# Patient Record
Sex: Female | Born: 1990 | Race: White | Hispanic: No | Smoking: Never smoker
Health system: Southern US, Community
[De-identification: ages and names within clinical notes are randomized; demographics above are authoritative.]

## PROBLEM LIST (undated history)

## (undated) DIAGNOSIS — O24419 Gestational diabetes mellitus in pregnancy, unspecified control: Secondary | ICD-10-CM

## (undated) HISTORY — PX: KNEE BIOPSY: SHX1893

## (undated) HISTORY — PX: CARPAL TUNNEL RELEASE: SHX101

---
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
## 2008-07-26 ENCOUNTER — Observation Stay: Payer: Self-pay

## 2009-03-02 ENCOUNTER — Emergency Department: Payer: Self-pay | Admitting: Emergency Medicine

## 2009-06-08 ENCOUNTER — Encounter: Payer: Self-pay | Admitting: Obstetrics and Gynecology

## 2009-06-15 ENCOUNTER — Encounter: Payer: Self-pay | Admitting: Obstetrics and Gynecology

## 2009-07-10 ENCOUNTER — Encounter: Payer: Self-pay | Admitting: Obstetrics & Gynecology

## 2010-05-06 ENCOUNTER — Emergency Department: Payer: Self-pay | Admitting: Emergency Medicine

## 2010-07-12 ENCOUNTER — Ambulatory Visit: Payer: Self-pay | Admitting: Family

## 2010-10-16 ENCOUNTER — Ambulatory Visit: Payer: Self-pay | Admitting: Internal Medicine

## 2010-11-10 ENCOUNTER — Emergency Department: Payer: Self-pay | Admitting: Emergency Medicine

## 2010-12-26 ENCOUNTER — Ambulatory Visit: Payer: Self-pay | Admitting: Specialist

## 2010-12-28 ENCOUNTER — Ambulatory Visit: Payer: Self-pay | Admitting: Specialist

## 2011-09-17 ENCOUNTER — Emergency Department: Payer: Self-pay | Admitting: Emergency Medicine

## 2014-03-21 DIAGNOSIS — Z8742 Personal history of other diseases of the female genital tract: Secondary | ICD-10-CM | POA: Insufficient documentation

## 2015-09-08 DIAGNOSIS — G43909 Migraine, unspecified, not intractable, without status migrainosus: Secondary | ICD-10-CM | POA: Insufficient documentation

## 2016-10-11 DIAGNOSIS — K219 Gastro-esophageal reflux disease without esophagitis: Secondary | ICD-10-CM | POA: Insufficient documentation

## 2016-10-11 DIAGNOSIS — L0292 Furuncle, unspecified: Secondary | ICD-10-CM | POA: Insufficient documentation

## 2016-10-11 DIAGNOSIS — F32A Depression, unspecified: Secondary | ICD-10-CM | POA: Insufficient documentation

## 2016-10-11 DIAGNOSIS — L0293 Carbuncle, unspecified: Secondary | ICD-10-CM | POA: Insufficient documentation

## 2016-10-11 DIAGNOSIS — F419 Anxiety disorder, unspecified: Secondary | ICD-10-CM | POA: Insufficient documentation

## 2017-08-14 DIAGNOSIS — G5603 Carpal tunnel syndrome, bilateral upper limbs: Secondary | ICD-10-CM | POA: Insufficient documentation

## 2018-03-24 DIAGNOSIS — M797 Fibromyalgia: Secondary | ICD-10-CM | POA: Insufficient documentation

## 2019-02-01 DIAGNOSIS — Z62810 Personal history of physical and sexual abuse in childhood: Secondary | ICD-10-CM | POA: Insufficient documentation

## 2020-10-11 DIAGNOSIS — M25461 Effusion, right knee: Secondary | ICD-10-CM | POA: Insufficient documentation

## 2020-12-11 DIAGNOSIS — Z9889 Other specified postprocedural states: Secondary | ICD-10-CM | POA: Insufficient documentation

## 2021-03-21 DIAGNOSIS — G8929 Other chronic pain: Secondary | ICD-10-CM | POA: Insufficient documentation

## 2021-03-23 DIAGNOSIS — E559 Vitamin D deficiency, unspecified: Secondary | ICD-10-CM | POA: Insufficient documentation

## 2021-03-23 DIAGNOSIS — R768 Other specified abnormal immunological findings in serum: Secondary | ICD-10-CM | POA: Insufficient documentation

## 2021-06-25 ENCOUNTER — Other Ambulatory Visit: Payer: Self-pay

## 2021-06-25 ENCOUNTER — Emergency Department: Payer: Medicaid Other

## 2021-06-25 ENCOUNTER — Emergency Department
Admission: EM | Admit: 2021-06-25 | Discharge: 2021-06-25 | Disposition: A | Discharge status: Home / Self Care | Payer: Medicaid Other | Attending: Emergency Medicine | Admitting: Emergency Medicine

## 2021-06-25 DIAGNOSIS — Z5321 Procedure and treatment not carried out due to patient leaving prior to being seen by health care provider: Secondary | ICD-10-CM | POA: Insufficient documentation

## 2021-06-25 DIAGNOSIS — R519 Headache, unspecified: Secondary | ICD-10-CM | POA: Diagnosis not present

## 2021-06-25 DIAGNOSIS — J029 Acute pharyngitis, unspecified: Secondary | ICD-10-CM | POA: Diagnosis not present

## 2021-06-25 DIAGNOSIS — R059 Cough, unspecified: Secondary | ICD-10-CM | POA: Diagnosis present

## 2021-06-25 NOTE — ED Triage Notes (Signed)
Pt here with flu-like symptoms that started on Friday. Pt states that she went to [facility] yesterday and they told her that she had the flu. Pt also has generalized pain and HA. Pt in NAD in triage.

## 2021-08-09 DIAGNOSIS — M5416 Radiculopathy, lumbar region: Secondary | ICD-10-CM | POA: Insufficient documentation

## 2021-09-18 DIAGNOSIS — M25512 Pain in left shoulder: Secondary | ICD-10-CM | POA: Insufficient documentation

## 2021-10-04 DIAGNOSIS — M25561 Pain in right knee: Secondary | ICD-10-CM | POA: Insufficient documentation

## 2021-10-27 ENCOUNTER — Other Ambulatory Visit: Payer: Self-pay

## 2021-10-27 ENCOUNTER — Ambulatory Visit
Admission: EM | Admit: 2021-10-27 | Discharge: 2021-10-27 | Disposition: A | Discharge status: Home / Self Care | Payer: Medicaid Other | Attending: Emergency Medicine | Admitting: Emergency Medicine

## 2021-10-27 DIAGNOSIS — L02416 Cutaneous abscess of left lower limb: Secondary | ICD-10-CM | POA: Diagnosis not present

## 2021-10-27 MED ORDER — CEPHALEXIN 500 MG PO CAPS: 500.0000 mg | ORAL_CAPSULE | Freq: Two times a day (BID) | ORAL | 0 refills | Status: AC

## 2021-10-27 NOTE — Discharge Instructions (Signed)
Take Keflex twice a day for the next 7 days ? ?Hold warm-hot compresses to affected area at least 4 times a day, this helps to facilitate draining, the more the better ? ?Please return for evaluation for increased swelling, increased tenderness or pain, non healing site, non draining site, you begin to have fever or chills  ? ?We reviewed the etiology of recurrent abscesses of skin.  Skin abscesses are collections of pus within the dermis and deeper skin tissues. Skin abscesses manifest as painful, tender, fluctuant, and erythematous nodules, frequently surmounted by a pustule and surrounded by a rim of erythematous swelling.  Spontaneous drainage of purulent material may occur.  Fever can occur on occasion.   ? ?-Skin abscesses can develop in healthy individuals with no predisposing conditions other than skin or nasal carriage of Staphylococcus aureus.  Individuals in close contact with others who have active infection with skin abscesses are at increased risk which is likely to explain why twin brother has similar episodes.   In addition, any process leading to a breach in the skin barrier can also predispose to the development of a skin abscesses, such as atopic dermatitis.    ?

## 2021-10-27 NOTE — ED Provider Notes (Signed)
?[facility] ? ? ? ?CSN: [id number] ?Arrival date & time: 10/27/21  8366 ? ? ?  ? ?History   ?Chief Complaint ?Chief Complaint  ?Patient presents with  ? Mass  ? ? ?HPI ?Stacey Reed is a 31 y.o. female.  ? ?Patient presents with boil to the inner left thigh for 2 to 3 days.  Endorses that it has increased in size become erythematous and tender.  Denies drainage, fever or chills.  Has not attempted treatment of symptoms. ? ?History reviewed. No pertinent past medical history. ? ?There are no problems to display for this patient. ? ? ?History reviewed. No pertinent surgical history. ? ?OB History   ?No obstetric history on file. ?  ? ? ? ?Home Medications   ? ?Prior to Admission medications   ?Medication Sig Start Date End Date Taking? Authorizing Provider  ?cephALEXin (KEFLEX) 500 MG capsule Take 1 capsule (500 mg total) by mouth 2 (two) times daily for 7 days. 10/27/21 11/03/21 Yes Valinda Hoar, NP  ? ? ?Family History ?History reviewed. No pertinent family history. ? ?Social History ?Social History  ? ?Tobacco Use  ? Smoking status: Never  ? Smokeless tobacco: Never  ?Vaping Use  ? Vaping Use: Never used  ?Substance Use Topics  ? Alcohol use: Never  ? Drug use: Never  ? ? ? ?Allergies   ?Doxycycline, Sulfa antibiotics, Acetaminophen-codeine, Metronidazole, and Nitrofurantoin ? ? ?Review of Systems ?Review of Systems  ?Constitutional: Negative.   ?Respiratory: Negative.    ?Cardiovascular: Negative.   ?Skin:  Positive for wound. Negative for color change, pallor and rash.  ?Neurological: Negative.   ? ? ?Physical Exam ?Triage Vital Signs ?ED Triage Vitals  ?Enc Vitals Group  ?   BP 10/27/21 0848 115/82  ?   Pulse Rate 10/27/21 0848 87  ?   Resp 10/27/21 0848 18  ?   Temp 10/27/21 0848 98.3 ?F (36.8 ?C)  ?   Temp Source 10/27/21 0848 Oral  ?   SpO2 10/27/21 0848 99 %  ?   Weight 10/27/21 0847 230 lb (104.3 kg)  ?   Height 10/27/21 0847 5' (1.524 m)  ?   Head Circumference --   ?   Peak Flow --   ?    Pain Score 10/27/21 0846 8  ?   Pain Loc --   ?   Pain Edu? --   ?   Excl. in GC? --   ? ?No data found. ? ?Updated Vital Signs ?BP 115/82 (BP Location: Left Arm)   Pulse 87   Temp 98.3 ?F (36.8 ?C) (Oral)   Resp 18   Ht 5' (1.524 m)   Wt 230 lb (104.3 kg)   LMP 10/27/2021   SpO2 99%   BMI 44.92 kg/m?  ? ?Visual Acuity ?Right Eye Distance:   ?Left Eye Distance:   ?Bilateral Distance:   ? ?Right Eye Near:   ?Left Eye Near:    ?Bilateral Near:    ? ?Physical Exam ?Constitutional:   ?   Appearance: Normal appearance.  ?HENT:  ?   Head: Normocephalic.  ?Eyes:  ?   Extraocular Movements: Extraocular movements intact.  ?Pulmonary:  ?   Effort: Pulmonary effort is normal.  ?Skin: ?   Comments: 1 x 2 cm immature abscess present to the medial aspect of the left thigh, erythematous and tender, nondraining  ?Neurological:  ?   Mental Status: She is alert and oriented to person, place, and time. Mental status  is at baseline.  ?Psychiatric:     ?   Mood and Affect: Mood normal.     ?   Behavior: Behavior normal.  ? ? ? ?UC Treatments / Results  ?Labs ?(all labs ordered are listed, but only abnormal results are displayed) ?Labs Reviewed - No data to display ? ?EKG ? ? ?Radiology ?No results found. ? ?Procedures ?Procedures (including critical care time) ? ?Medications Ordered in UC ?Medications - No data to display ? ?Initial Impression / Assessment and Plan / UC Course  ?I have reviewed the triage vital signs and the nursing notes. ? ?Pertinent labs & imaging results that were available during my care of the patient were reviewed by me and considered in my medical decision making (see chart for details). ? ?Abscess of left thigh ? ?Abscess is not at a point to be drained, discussed with patient, Keflex 7-day course prescribed, advised warm compresses to the affected area at least 4 times daily to help facilitate drainage, given strict precautions to return to urgent care for nonhealing nondraining site for further  evaluation and management ?Final Clinical Impressions(s) / UC Diagnoses  ? ?Final diagnoses:  ?Abscess of left thigh  ? ? ? ?Discharge Instructions   ? ?  ?Take Keflex twice a day for the next 7 days ? ?Hold warm-hot compresses to affected area at least 4 times a day, this helps to facilitate draining, the more the better ? ?Please return for evaluation for increased swelling, increased tenderness or pain, non healing site, non draining site, you begin to have fever or chills  ? ?We reviewed the etiology of recurrent abscesses of skin.  Skin abscesses are collections of pus within the dermis and deeper skin tissues. Skin abscesses manifest as painful, tender, fluctuant, and erythematous nodules, frequently surmounted by a pustule and surrounded by a rim of erythematous swelling.  Spontaneous drainage of purulent material may occur.  Fever can occur on occasion.   ? ?-Skin abscesses can develop in healthy individuals with no predisposing conditions other than skin or nasal carriage of Staphylococcus aureus.  Individuals in close contact with others who have active infection with skin abscesses are at increased risk which is likely to explain why twin brother has similar episodes.   In addition, any process leading to a breach in the skin barrier can also predispose to the development of a skin abscesses, such as atopic dermatitis.    ? ? ?ED Prescriptions   ? ? Medication Sig Dispense Auth. Provider  ? cephALEXin (KEFLEX) 500 MG capsule Take 1 capsule (500 mg total) by mouth 2 (two) times daily for 7 days. 14 capsule Valinda Hoar, NP  ? ?  ? ?PDMP not reviewed this encounter. ?  ?Valinda Hoar, NP ?10/27/21 8916 ? ?

## 2021-10-27 NOTE — ED Triage Notes (Signed)
Pt c/o boil on left inner thigh x3days. Pt states that the boil is not oozing and feels soft. Pt states that it is red and purple in color.  ? ?Pt states that it is red and has gotten bigger in the last few days.  ?

## 2021-11-16 ENCOUNTER — Other Ambulatory Visit: Payer: Self-pay

## 2021-11-16 ENCOUNTER — Ambulatory Visit
Admission: EM | Admit: 2021-11-16 | Discharge: 2021-11-16 | Disposition: A | Discharge status: Home / Self Care | Payer: Medicaid Other | Attending: Internal Medicine | Admitting: Internal Medicine

## 2021-11-16 DIAGNOSIS — M79651 Pain in right thigh: Secondary | ICD-10-CM

## 2021-11-16 LAB — URINALYSIS, MICROSCOPIC (REFLEX)

## 2021-11-16 LAB — URINALYSIS, ROUTINE W REFLEX MICROSCOPIC
Bilirubin Urine: NEGATIVE
Glucose, UA: NEGATIVE mg/dL
Ketones, ur: NEGATIVE mg/dL
Leukocytes,Ua: NEGATIVE
Nitrite: NEGATIVE
Protein, ur: NEGATIVE mg/dL
Specific Gravity, Urine: 1.02 (ref 1.005–1.030)
pH: 6 (ref 5.0–8.0)

## 2021-11-16 MED ORDER — METHOCARBAMOL 500 MG PO TABS: 500.0000 mg | ORAL_TABLET | Freq: Every evening | ORAL | 0 refills | Status: DC | PRN
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
## 2021-11-16 NOTE — ED Triage Notes (Signed)
Patient is here for "UTI symptoms" including urinary frequency with urgency, "burning at times". Note: Started with "right hip pain at first" (no injury). No fever.  ?

## 2021-11-16 NOTE — Discharge Instructions (Signed)
Gentle stretching exercises ?Heating pad use on a 20-minute on-20 minutes off cycle ?Continue ibuprofen use ?Please do not drive or operate heavy machinery after taking muscle relaxants because muscle relaxants makes you drowsy ?Please return to urgent care if you have any other concerns. ?

## 2021-11-30 ENCOUNTER — Other Ambulatory Visit: Payer: Self-pay

## 2021-11-30 ENCOUNTER — Encounter: Payer: Self-pay | Admitting: Emergency Medicine

## 2021-11-30 ENCOUNTER — Ambulatory Visit
Admission: EM | Admit: 2021-11-30 | Discharge: 2021-11-30 | Disposition: A | Discharge status: Home / Self Care | Payer: Medicaid Other | Attending: Emergency Medicine | Admitting: Emergency Medicine

## 2021-11-30 DIAGNOSIS — R0982 Postnasal drip: Secondary | ICD-10-CM | POA: Diagnosis not present

## 2021-11-30 DIAGNOSIS — J309 Allergic rhinitis, unspecified: Secondary | ICD-10-CM

## 2021-11-30 LAB — GROUP A STREP BY PCR: Group A Strep by PCR: NOT DETECTED

## 2021-11-30 MED ORDER — IPRATROPIUM BROMIDE 0.06 % NA SOLN: 2.0000 | Freq: Four times a day (QID) | NASAL | 12 refills | Status: DC

## 2021-11-30 MED ORDER — CETIRIZINE HCL 10 MG PO TABS
10.0000 mg | ORAL_TABLET | Freq: Every day | ORAL | 2 refills | Status: DC
Start: 2021-11-30 — End: 2022-08-23

## 2021-11-30 NOTE — Discharge Instructions (Addendum)
Take Zyrtec 10 mg once daily for control of your allergy symptoms. ? ?Perform sinus irrigation with a NeilMed sinus rinse kit and distilled water 2-3 times a day to wash away pollen particles and mold spores which could be attributing to nasal congestion and mucus production. ? ?Use the Atrovent nasal spray, 2 squirts up each nostril 4 times a day, as needed for nasal congestion and postnasal drip. ? ?Return for reevaluation for any new or worsening symptoms.   ?

## 2021-11-30 NOTE — ED Triage Notes (Signed)
Patient c/o sore throat for a week.  Patient denies fevers.  
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
## 2022-02-06 ENCOUNTER — Encounter: Payer: Self-pay | Admitting: *Deleted

## 2022-02-06 ENCOUNTER — Emergency Department
Admission: EM | Admit: 2022-02-06 | Discharge: 2022-02-06 | Disposition: A | Discharge status: Home / Self Care | Payer: Medicaid Other | Attending: Emergency Medicine | Admitting: Emergency Medicine

## 2022-02-06 ENCOUNTER — Emergency Department: Payer: Medicaid Other

## 2022-02-06 ENCOUNTER — Other Ambulatory Visit: Payer: Self-pay

## 2022-02-06 DIAGNOSIS — G8929 Other chronic pain: Secondary | ICD-10-CM | POA: Insufficient documentation

## 2022-02-06 DIAGNOSIS — M25561 Pain in right knee: Secondary | ICD-10-CM | POA: Diagnosis not present

## 2022-02-06 MED ORDER — IBUPROFEN 600 MG PO TABS
600.0000 mg | ORAL_TABLET | Freq: Once | ORAL | Status: AC
  Administered 2022-02-06: 600 mg via ORAL
  Filled 2022-02-06: qty 1

## 2022-02-06 NOTE — Discharge Instructions (Signed)
Please follow-up with orthopedics for further management of your knee pain.  Please return for any new, worsening, or change in symptoms or other concerns including, swelling, fever, or any other concerns.  It was a pleasure caring for you today.
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
## 2022-02-06 NOTE — ED Triage Notes (Signed)
Pt has right knee pain    no known injury.   Pt reports swelling   sx for 2 days.  Pt alert

## 2022-03-25 ENCOUNTER — Other Ambulatory Visit: Payer: Medicaid Other

## 2022-06-08 ENCOUNTER — Emergency Department: Payer: Medicaid Other

## 2022-06-08 ENCOUNTER — Other Ambulatory Visit: Payer: Self-pay

## 2022-06-08 DIAGNOSIS — R079 Chest pain, unspecified: Secondary | ICD-10-CM | POA: Insufficient documentation

## 2022-06-08 DIAGNOSIS — Z5321 Procedure and treatment not carried out due to patient leaving prior to being seen by health care provider: Secondary | ICD-10-CM | POA: Diagnosis not present

## 2022-06-08 LAB — CBC
HCT: 36.7 % (ref 36.0–46.0)
Hemoglobin: 11.8 g/dL — ABNORMAL LOW (ref 12.0–15.0)
MCH: 27.4 pg (ref 26.0–34.0)
MCHC: 32.2 g/dL (ref 30.0–36.0)
MCV: 85.2 fL (ref 80.0–100.0)
Platelets: 297 10*3/uL (ref 150–400)
RBC: 4.31 MIL/uL (ref 3.87–5.11)
RDW: 12.8 % (ref 11.5–15.5)
WBC: 14.2 10*3/uL — ABNORMAL HIGH (ref 4.0–10.5)
nRBC: 0 % (ref 0.0–0.2)

## 2022-06-08 LAB — COMPREHENSIVE METABOLIC PANEL
ALT: 16 U/L (ref 0–44)
AST: 14 U/L — ABNORMAL LOW (ref 15–41)
Albumin: 3.9 g/dL (ref 3.5–5.0)
Alkaline Phosphatase: 51 U/L (ref 38–126)
Anion gap: 6 (ref 5–15)
BUN: 14 mg/dL (ref 6–20)
CO2: 26 mmol/L (ref 22–32)
Calcium: 8.8 mg/dL — ABNORMAL LOW (ref 8.9–10.3)
Chloride: 107 mmol/L (ref 98–111)
Creatinine, Ser: 0.9 mg/dL (ref 0.44–1.00)
GFR, Estimated: >60 mL/min (ref >60)
Glucose, Bld: 105 mg/dL — ABNORMAL HIGH (ref 70–99)
Potassium: 3.7 mmol/L (ref 3.5–5.1)
Sodium: 139 mmol/L (ref 135–145)
Total Bilirubin: 0.6 mg/dL (ref 0.3–1.2)
Total Protein: 7.1 g/dL (ref 6.5–8.1)

## 2022-06-08 LAB — TROPONIN I (HIGH SENSITIVITY): Troponin I (High Sensitivity): <2 ng/L (ref <18)

## 2022-06-08 NOTE — ED Provider Triage Note (Signed)
Emergency Medicine Provider Triage Evaluation Note  Stacey Reed , a 31 y.o. female  was evaluated in triage.  Pt complains of chest pain on L side. Radiates from L chest into L shoulder. No shob, URI symptoms, GI symptoms. Symptoms x 1 month. No alleviates or worsens symptoms  Review of Systems  Positive: CP Negative: Cough, shob, fever, chills  Physical Exam  There were no vitals taken for this visit. Gen:   Awake, no distress   Resp:  Normal effort  MSK:   Moves extremities without difficulty  Other:    Medical Decision Making  Medically screening exam initiated at 8:30 PM.  Appropriate orders placed.  Stacey Reed was informed that the remainder of the evaluation will be completed by another provider, this initial triage assessment does not replace that evaluation, and the importance of remaining in the ED until their evaluation is complete.  CP x 1 month. Cardiac workup with labs, ekg, chest xray   Darletta Moll, PA-C 06/08/22 2034

## 2022-06-08 NOTE — ED Triage Notes (Signed)
Pov from home with L sided chest pain for 1 month. PT stating pain with palpation, radiating through back and down L arm.  NAD in triage.

## 2022-06-09 ENCOUNTER — Emergency Department
Admission: EM | Admit: 2022-06-09 | Discharge: 2022-06-09 | Discharge status: Against Medical Advice | Payer: Medicaid Other | Attending: Emergency Medicine | Admitting: Emergency Medicine

## 2022-06-27 ENCOUNTER — Ambulatory Visit: Payer: Medicaid Other | Admitting: Nurse Practitioner

## 2022-06-29 ENCOUNTER — Other Ambulatory Visit: Payer: Self-pay

## 2022-06-29 ENCOUNTER — Emergency Department: Payer: Medicaid Other

## 2022-06-29 DIAGNOSIS — M5412 Radiculopathy, cervical region: Secondary | ICD-10-CM | POA: Diagnosis not present

## 2022-06-29 DIAGNOSIS — R0789 Other chest pain: Secondary | ICD-10-CM | POA: Diagnosis present

## 2022-06-29 LAB — CBC
HCT: 37.4 % (ref 36.0–46.0)
Hemoglobin: 12.1 g/dL (ref 12.0–15.0)
MCH: 27.2 pg (ref 26.0–34.0)
MCHC: 32.4 g/dL (ref 30.0–36.0)
MCV: 84 fL (ref 80.0–100.0)
Platelets: 326 10*3/uL (ref 150–400)
RBC: 4.45 MIL/uL (ref 3.87–5.11)
RDW: 13 % (ref 11.5–15.5)
WBC: 13.4 10*3/uL — ABNORMAL HIGH (ref 4.0–10.5)
nRBC: 0 % (ref 0.0–0.2)

## 2022-06-29 LAB — BASIC METABOLIC PANEL
Anion gap: 6 (ref 5–15)
BUN: 17 mg/dL (ref 6–20)
CO2: 27 mmol/L (ref 22–32)
Calcium: 9.1 mg/dL (ref 8.9–10.3)
Chloride: 108 mmol/L (ref 98–111)
Creatinine, Ser: 1.03 mg/dL — ABNORMAL HIGH (ref 0.44–1.00)
GFR, Estimated: >60 mL/min (ref >60)
Glucose, Bld: 101 mg/dL — ABNORMAL HIGH (ref 70–99)
Potassium: 3.8 mmol/L (ref 3.5–5.1)
Sodium: 141 mmol/L (ref 135–145)

## 2022-06-29 LAB — TROPONIN I (HIGH SENSITIVITY): Troponin I (High Sensitivity): <2 ng/L (ref <18)

## 2022-06-29 NOTE — ED Triage Notes (Signed)
Pt to the ED with complaint of left sided chest pain for over a month.  Radiates to back and down left arm.  Describes it as sharp.  No n/v/d or shob.

## 2022-06-30 ENCOUNTER — Emergency Department
Admission: EM | Admit: 2022-06-30 | Discharge: 2022-06-30 | Disposition: A | Discharge status: Home / Self Care | Payer: Medicaid Other | Attending: Emergency Medicine | Admitting: Emergency Medicine

## 2022-06-30 DIAGNOSIS — M25512 Pain in left shoulder: Secondary | ICD-10-CM

## 2022-06-30 DIAGNOSIS — M5412 Radiculopathy, cervical region: Secondary | ICD-10-CM

## 2022-06-30 DIAGNOSIS — R079 Chest pain, unspecified: Secondary | ICD-10-CM

## 2022-06-30 LAB — HCG, QUANTITATIVE, PREGNANCY: hCG, Beta Chain, Quant, S: <1 m[IU]/mL (ref <5)

## 2022-06-30 MED ORDER — CYCLOBENZAPRINE HCL 10 MG PO TABS
5.0000 mg | ORAL_TABLET | Freq: Once | ORAL | Status: AC
  Administered 2022-06-30: 5 mg via ORAL
  Filled 2022-06-30: qty 1

## 2022-06-30 MED ORDER — CYCLOBENZAPRINE HCL 5 MG PO TABS: 5.0000 mg | ORAL_TABLET | Freq: Three times a day (TID) | ORAL | 0 refills | Status: AC | PRN

## 2022-06-30 NOTE — ED Provider Notes (Signed)
[facility] Provider Note   Event Date/Time   First MD Initiated Contact with Patient 06/30/22 0102     (approximate) History  Chest Pain  HPI Stacey Reed is a 31 y.o. female with a stated past medical history of lumbar radiculopathy, chronic migraines, morbid obesity, and fibromyalgia who presents for left upper chest wall pain that radiates through to her scapula and occasionally radiates down the left arm with a description of a shooting sharp pain.  Patient endorses worsening of this pain with movement at the neck including trying to get up from laying flat or turning her head to the left.  Patient denies any known relieving factors.  Patient states that she has been trying to use ibuprofen and Tylenol with minimal effect ROS: Patient currently denies any vision changes, tinnitus, difficulty speaking, facial droop, sore throat, chest pain, shortness of breath, abdominal pain, nausea/vomiting/diarrhea, dysuria, or weakness/numbness in any extremity   Physical Exam  Triage Vital Signs: ED Triage Vitals  Enc Vitals Group     BP 06/29/22 2231 114/85     Pulse Rate 06/29/22 2231 78     Resp 06/29/22 2231 17     Temp 06/29/22 2231 97.6 F (36.4 C)     Temp Source 06/29/22 2231 Oral     SpO2 06/29/22 2231 98 %     Weight --      Height --      Head Circumference --      Peak Flow --      Pain Score 06/29/22 2232 5     Pain Loc --      Pain Edu? --      Excl. in GC? --    Most recent vital signs: Vitals:   06/29/22 2231  BP: 114/85  Pulse: 78  Resp: 17  Temp: 97.6 F (36.4 C)  SpO2: 98%   General: Awake, oriented x4. CV:  Good peripheral perfusion.  Resp:  Normal effort.  Abd:  No distention.  Other:  Middle-aged morbidly obese Caucasian female laying in bed in no acute distress.  Spurling test positive on the left for left arm paresthesias.  Full range of motion and sensation intact as well as strength in the left arm ED Results / Procedures /  Treatments  Labs (all labs ordered are listed, but only abnormal results are displayed) Labs Reviewed  BASIC METABOLIC PANEL - Abnormal; Notable for the following components:      Result Value   Glucose, Bld 101 (*)    Creatinine, Ser 1.03 (*)    All other components within normal limits  CBC - Abnormal; Notable for the following components:   WBC 13.4 (*)    All other components within normal limits  HCG, QUANTITATIVE, PREGNANCY  TROPONIN I (HIGH SENSITIVITY)  TROPONIN I (HIGH SENSITIVITY)   EKG ED ECG REPORT I, Merwyn Katos, the attending physician, personally viewed and interpreted this ECG. Date: 06/30/2022 EKG Time: 2237 Rate: 86 Rhythm: normal sinus rhythm QRS Axis: normal Intervals: normal ST/T Wave abnormalities: normal Narrative Interpretation: no evidence of acute ischemia RADIOLOGY ED MD interpretation: 2 view chest x-ray interpreted by me shows no evidence of acute abnormalities including no pneumonia, pneumothorax, or widened mediastinum -Agree with radiology assessment Official radiology report(s): DG Chest 2 View  Result Date: 06/29/2022 CLINICAL DATA:  Chest pain.  Left-sided pain. EXAM: CHEST - 2 VIEW COMPARISON:  06/08/2022 FINDINGS: The cardiomediastinal contours are normal. The lungs are clear. Pulmonary vasculature is normal.  No consolidation, pleural effusion, or pneumothorax. No acute osseous abnormalities are seen. IMPRESSION: Negative radiographs of the chest. Electronically Signed   By: Keith Rake M.D.   On: 06/29/2022 22:54   PROCEDURES: Critical Care performed: No .1-3 Lead EKG Interpretation  Performed by: Naaman Plummer, MD Authorized by: Naaman Plummer, MD     Interpretation: normal     ECG rate:  79   ECG rate assessment: normal     Rhythm: sinus rhythm     Ectopy: none     Conduction: normal    MEDICATIONS ORDERED IN ED: Medications  cyclobenzaprine (FLEXERIL) tablet 5 mg (5 mg Oral Given 06/30/22 0104)   IMPRESSION / MDM /  ASSESSMENT AND PLAN / ED COURSE  I reviewed the triage vital signs and the nursing notes.                             The patient is on the cardiac monitor to evaluate for evidence of arrhythmia and/or significant heart rate changes. Patient's presentation is most consistent with acute presentation with potential threat to life or bodily function. This patient presents with atypical chest pain, most likely secondary to musculoskeletal injury. Differential diagnosis includes rib fracture, costochondritis, sternal fracture. Low suspicion for ACS, acute PE (PERC negative), pericarditis / myocarditis, thoracic aortic dissection, pneumothorax, pneumonia or other acute infectious process. Presentation not consistent with other acute, emergent causes of chest pain at this time. No indication for cardiac enzyme testing. Plan to order CXR to evaluate for acute cardiopulmonary causes.  Plan: EKG, CXR, pain control  Dispo: Discharge home with home care   FINAL CLINICAL IMPRESSION(S) / ED DIAGNOSES   Final diagnoses:  Left-sided chest pain  Acute pain of left shoulder  Cervical radiculopathy   Rx / DC Orders   ED Discharge Orders          Ordered    cyclobenzaprine (FLEXERIL) 5 MG tablet  3 times daily PRN        06/30/22 0142           Note:  This document was prepared using Dragon voice recognition software and may include unintentional dictation errors.   Naaman Plummer, MD 06/30/22 [phone]

## 2022-06-30 NOTE — Discharge Instructions (Addendum)
Please use ibuprofen (Motrin) up to 800 mg every 8 hours, naproxen (Naprosyn) up to 500 mg every 12 hours, and/or acetaminophen (Tylenol) up to 4 g/day for any continued pain 

## 2022-07-15 ENCOUNTER — Ambulatory Visit
Admission: RE | Admit: 2022-07-15 | Discharge: 2022-07-15 | Disposition: A | Discharge status: Home / Self Care | Payer: Self-pay | Source: Ambulatory Visit | Attending: Neurosurgery | Admitting: Neurosurgery

## 2022-07-15 ENCOUNTER — Other Ambulatory Visit: Payer: Self-pay

## 2022-07-15 DIAGNOSIS — Z049 Encounter for examination and observation for unspecified reason: Secondary | ICD-10-CM

## 2022-07-16 ENCOUNTER — Other Ambulatory Visit: Payer: Self-pay

## 2022-07-16 ENCOUNTER — Emergency Department
Admission: EM | Admit: 2022-07-16 | Discharge: 2022-07-16 | Disposition: A | Discharge status: Home / Self Care | Payer: Medicaid Other | Attending: Student in an Organized Health Care Education/Training Program | Admitting: Student in an Organized Health Care Education/Training Program

## 2022-07-16 DIAGNOSIS — M542 Cervicalgia: Secondary | ICD-10-CM | POA: Insufficient documentation

## 2022-07-16 DIAGNOSIS — R519 Headache, unspecified: Secondary | ICD-10-CM | POA: Insufficient documentation

## 2022-07-16 DIAGNOSIS — Y92410 Unspecified street and highway as the place of occurrence of the external cause: Secondary | ICD-10-CM | POA: Diagnosis not present

## 2022-07-16 MED ORDER — CYCLOBENZAPRINE HCL 10 MG PO TABS: 10.0000 mg | ORAL_TABLET | Freq: Three times a day (TID) | ORAL | 0 refills | Status: AC | PRN

## 2022-07-16 MED ORDER — MELOXICAM 15 MG PO TABS: 15.0000 mg | ORAL_TABLET | Freq: Every day | ORAL | 0 refills | Status: AC
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
## 2022-07-16 NOTE — Discharge Instructions (Addendum)
-  You may take Tylenol and meloxicam as needed for pain.  You may additionally take cyclobenzaprine as needed for muscle relaxation, to use caution as may make you dizzy/drowsy and increase your risk of falls.  If he makes you too drowsy during the day, you can opt to only taking it at night.  -Return to the emergency department at any time if you begin to experience any new or worsening symptoms.
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
## 2022-07-25 MED ORDER — DEXAMETHASONE SODIUM PHOSPHATE 10 MG/ML IJ SOLN
INTRAMUSCULAR | Status: AC
  Filled 2022-07-25: qty 1

## 2022-07-25 MED ORDER — MIDAZOLAM HCL 2 MG/2ML IJ SOLN
INTRAMUSCULAR | Status: AC
  Filled 2022-07-25: qty 2

## 2022-07-25 MED ORDER — FENTANYL CITRATE (PF) 100 MCG/2ML IJ SOLN
INTRAMUSCULAR | Status: AC
  Filled 2022-07-25: qty 2

## 2022-07-25 MED ORDER — LIDOCAINE HCL (PF) 2 % IJ SOLN
INTRAMUSCULAR | Status: AC
  Filled 2022-07-25: qty 5

## 2022-07-25 MED ORDER — PROPOFOL 10 MG/ML IV BOLUS
INTRAVENOUS | Status: AC
  Filled 2022-07-25: qty 40

## 2022-07-25 MED ORDER — ONDANSETRON HCL 4 MG/2ML IJ SOLN
INTRAMUSCULAR | Status: AC
  Filled 2022-07-25: qty 2

## 2022-07-25 MED ORDER — GLYCOPYRROLATE 0.2 MG/ML IJ SOLN
INTRAMUSCULAR | Status: AC
  Filled 2022-07-25: qty 1

## 2022-07-30 ENCOUNTER — Ambulatory Visit: Payer: Medicaid Other | Admitting: Neurosurgery

## 2022-08-23 ENCOUNTER — Ambulatory Visit
Admission: EM | Admit: 2022-08-23 | Discharge: 2022-08-23 | Disposition: A | Discharge status: Home / Self Care | Payer: Medicaid Other | Attending: Emergency Medicine | Admitting: Emergency Medicine

## 2022-08-23 DIAGNOSIS — R21 Rash and other nonspecific skin eruption: Secondary | ICD-10-CM | POA: Diagnosis not present

## 2022-08-23 LAB — URINALYSIS, ROUTINE W REFLEX MICROSCOPIC
Bilirubin Urine: NEGATIVE
Glucose, UA: NEGATIVE mg/dL
Ketones, ur: NEGATIVE mg/dL
Nitrite: NEGATIVE
Protein, ur: NEGATIVE mg/dL
Specific Gravity, Urine: >1.03 — ABNORMAL HIGH (ref 1.005–1.030)
pH: 5.5 (ref 5.0–8.0)

## 2022-08-23 LAB — PREGNANCY, URINE: Preg Test, Ur: NEGATIVE

## 2022-08-23 LAB — URINALYSIS, MICROSCOPIC (REFLEX)

## 2022-08-23 MED ORDER — CETIRIZINE HCL 10 MG PO TABS: 10.0000 mg | ORAL_TABLET | Freq: Every day | ORAL | 2 refills | Status: AC

## 2022-08-23 MED ORDER — PREDNISONE 10 MG (21) PO TBPK: ORAL_TABLET | ORAL | 0 refills | Status: DC

## 2022-08-23 MED ORDER — FAMOTIDINE 20 MG PO TABS: 20.0000 mg | ORAL_TABLET | Freq: Two times a day (BID) | ORAL | 0 refills | Status: AC

## 2022-08-23 NOTE — ED Triage Notes (Addendum)
Pt c/o rash & itchiness in legs bilaterally x1 day. Also requesting pregnancy test pt states she is 1 mon late on her menstrual. Pt also c/o urinary frequency this wk & lower back pain.

## 2022-08-23 NOTE — Discharge Instructions (Signed)
Take Zyrtec 10 mg daily to help with your itching.  You can take over-the-counter Benadryl, 50 mg at bedtime, as needed for itching and sleep.  Take the prednisone pack according to the package instructions.  You will taken on tapering dose over a period of 6 days.  Take it with food and always take it first in the morning with breakfast.  Take over-the-counter Pepcid 20 mg twice daily to help with itching as well.  If you develop any swelling of your lips or tongue, tightness in your throat, or difficulty breathing you need to go to the ER for evaluation.
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
## 2022-08-25 LAB — URINE CULTURE

## 2022-09-09 ENCOUNTER — Ambulatory Visit
Admission: EM | Admit: 2022-09-09 | Discharge: 2022-09-09 | Disposition: A | Discharge status: Home / Self Care | Payer: Medicaid Other | Attending: Physician Assistant | Admitting: Physician Assistant

## 2022-09-09 ENCOUNTER — Other Ambulatory Visit: Payer: Self-pay

## 2022-09-09 DIAGNOSIS — R519 Headache, unspecified: Secondary | ICD-10-CM

## 2022-09-09 DIAGNOSIS — R110 Nausea: Secondary | ICD-10-CM | POA: Diagnosis present

## 2022-09-09 DIAGNOSIS — Z3201 Encounter for pregnancy test, result positive: Secondary | ICD-10-CM

## 2022-09-09 LAB — PREGNANCY, URINE: Preg Test, Ur: POSITIVE — AB

## 2022-09-09 NOTE — ED Triage Notes (Signed)
Pt reports 2 weeks of headaches and nausea. Took pregnancy test yesterday and it was positive. Wants confirmation
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
## 2022-09-09 NOTE — Discharge Instructions (Signed)
-  Your pregnancy test was positive here today.  As we discussed, you need to follow-up with OB/GYN.  If you prefer to go to [facility], reach out to your PCP so that you may obtain referral to [place] can take Tylenol as needed for discomfort and use a heating pad. - If you experience any worsening of your lower abdominal cramping pain/pelvic pain or heavy bleeding, go to emergency department. -May use cyclobenzaprine once at nighttime as needed. As you are not taking high doses, this should be okay.  However, if you do not need it, do not take it.

## 2023-01-19 ENCOUNTER — Other Ambulatory Visit: Payer: Self-pay

## 2023-01-19 ENCOUNTER — Emergency Department
Admission: EM | Admit: 2023-01-19 | Discharge: 2023-01-19 | Disposition: A | Discharge status: Home / Self Care | Payer: Medicaid Other | Attending: Emergency Medicine | Admitting: Emergency Medicine

## 2023-01-19 DIAGNOSIS — R1012 Left upper quadrant pain: Secondary | ICD-10-CM | POA: Insufficient documentation

## 2023-01-19 DIAGNOSIS — D72829 Elevated white blood cell count, unspecified: Secondary | ICD-10-CM | POA: Insufficient documentation

## 2023-01-19 DIAGNOSIS — O99111 Other diseases of the blood and blood-forming organs and certain disorders involving the immune mechanism complicating pregnancy, first trimester: Secondary | ICD-10-CM | POA: Insufficient documentation

## 2023-01-19 DIAGNOSIS — O26891 Other specified pregnancy related conditions, first trimester: Secondary | ICD-10-CM | POA: Insufficient documentation

## 2023-01-19 LAB — CBC WITH DIFFERENTIAL/PLATELET
Abs Immature Granulocytes: 0.07 10*3/uL (ref 0.00–0.07)
Basophils Absolute: 0.1 10*3/uL (ref 0.0–0.1)
Basophils Relative: 1 %
Eosinophils Absolute: 0.3 10*3/uL (ref 0.0–0.5)
Eosinophils Relative: 2 %
HCT: 37.6 % (ref 36.0–46.0)
Hemoglobin: 12.2 g/dL (ref 12.0–15.0)
Immature Granulocytes: 1 %
Lymphocytes Relative: 30 %
Lymphs Abs: 4 10*3/uL (ref 0.7–4.0)
MCH: 27.7 pg (ref 26.0–34.0)
MCHC: 32.4 g/dL (ref 30.0–36.0)
MCV: 85.5 fL (ref 80.0–100.0)
Monocytes Absolute: 0.9 10*3/uL (ref 0.1–1.0)
Monocytes Relative: 7 %
Neutro Abs: 8.2 10*3/uL — ABNORMAL HIGH (ref 1.7–7.7)
Neutrophils Relative %: 59 %
Platelets: 300 10*3/uL (ref 150–400)
RBC: 4.4 MIL/uL (ref 3.87–5.11)
RDW: 12.8 % (ref 11.5–15.5)
WBC: 13.6 10*3/uL — ABNORMAL HIGH (ref 4.0–10.5)
nRBC: 0 % (ref 0.0–0.2)

## 2023-01-19 LAB — URINALYSIS, COMPLETE (UACMP) WITH MICROSCOPIC
Bacteria, UA: NONE SEEN
Bilirubin Urine: NEGATIVE
Glucose, UA: NEGATIVE mg/dL
Hgb urine dipstick: NEGATIVE
Ketones, ur: NEGATIVE mg/dL
Nitrite: NEGATIVE
Protein, ur: NEGATIVE mg/dL
Specific Gravity, Urine: 1.024 (ref 1.005–1.030)
pH: 5 (ref 5.0–8.0)

## 2023-01-19 LAB — BASIC METABOLIC PANEL
Anion gap: 6 (ref 5–15)
BUN: 17 mg/dL (ref 6–20)
CO2: 24 mmol/L (ref 22–32)
Calcium: 9 mg/dL (ref 8.9–10.3)
Chloride: 104 mmol/L (ref 98–111)
Creatinine, Ser: 0.59 mg/dL (ref 0.44–1.00)
GFR, Estimated: >60 mL/min (ref >60)
Glucose, Bld: 104 mg/dL — ABNORMAL HIGH (ref 70–99)
Potassium: 3.7 mmol/L (ref 3.5–5.1)
Sodium: 134 mmol/L — ABNORMAL LOW (ref 135–145)

## 2023-01-19 LAB — HCG, QUANTITATIVE, PREGNANCY: hCG, Beta Chain, Quant, S: 79980 m[IU]/mL — ABNORMAL HIGH (ref <5)

## 2023-01-19 NOTE — Discharge Instructions (Signed)
May take Tylenol as needed for pain.  Use an over-the-counter stool softener or laxative to help with constipation.  You can use MiraLAX which is probably the best choice, or Colace.  Follow-up with your OB/GYN.  Return to the ER for new, worsening, or persistent severe pain, constant pain, fever or chills, worsening vomiting, urinary symptoms, vaginal bleeding, or any other new or worsening symptoms that concern you.

## 2023-01-19 NOTE — ED Notes (Signed)
POC Preg FINAL RESULT: POSITIVE

## 2023-01-19 NOTE — ED Provider Notes (Signed)
[facility] Provider Note    Event Date/Time   First MD Initiated Contact with Patient 01/19/23 2030     (approximate)   History   Flank Pain (L)   HPI {Remember to add pertinent medical, surgical, social, and/or OB history to HPI:1} Stacey Reed is a 32 y.o. female  ***   Left upper quadrant pain since yesterday, intermittent, sharp, not related to specific movements, no other significant associated symptoms   Physical Exam   Triage Vital Signs: ED Triage Vitals  Enc Vitals Group     BP 01/19/23 2012 113/72     Pulse Rate 01/19/23 2012 91     Resp 01/19/23 2012 18     Temp 01/19/23 2012 98.6 F (37 C)     Temp src --      SpO2 01/19/23 2012 99 %     Weight 01/19/23 2011 240 lb 4.8 oz (109 kg)     Height 01/19/23 2011 5' (1.524 m)     Head Circumference --      Peak Flow --      Pain Score 01/19/23 2013 7     Pain Loc --      Pain Edu? --      Excl. in GC? --     Most recent vital signs: Vitals:   01/19/23 2012  BP: 113/72  Pulse: 91  Resp: 18  Temp: 98.6 F (37 C)  SpO2: 99%    {Only need to document appropriate and relevant physical exam:1} General: Awake, no distress. *** CV:  Good peripheral perfusion. *** Resp:  Normal effort. *** Abd:  No distention. *** Other:  ***   ED Results / Procedures / Treatments   Labs (all labs ordered are listed, but only abnormal results are displayed) Labs Reviewed  CBC WITH DIFFERENTIAL/PLATELET - Abnormal; Notable for the following components:      Result Value   WBC 13.6 (*)    Neutro Abs 8.2 (*)    All other components within normal limits  BASIC METABOLIC PANEL - Abnormal; Notable for the following components:   Sodium 134 (*)    Glucose, Bld 104 (*)    All other components within normal limits  URINALYSIS, COMPLETE (UACMP) WITH MICROSCOPIC - Abnormal; Notable for the following components:   Color, Urine YELLOW (*)    APPearance HAZY (*)    Leukocytes,Ua TRACE (*)    All  other components within normal limits  HCG, QUANTITATIVE, PREGNANCY     EKG  ***   RADIOLOGY *** {USE THE WORD "INTERPRETED"!! You MUST document your own interpretation of imaging, as well as the fact that you reviewed the radiologist's report!:1}   PROCEDURES:  Critical Care performed: {CriticalCareYesNo:19197::"Yes, see critical care procedure note(s)","No"}  Procedures   MEDICATIONS ORDERED IN ED: Medications - No data to display   IMPRESSION / MDM / ASSESSMENT AND PLAN / ED COURSE  I reviewed the triage vital signs and the nursing notes.                              Differential diagnosis includes, but is not limited to, ***  Patient's presentation is most consistent with {EM COPA:27473}  *** {If the patient is on the monitor, remove the brackets and asterisks on the sentence below and remember to document it as a Procedure as well. Otherwise delete the sentence below:1} {**The patient is on the cardiac monitor to evaluate  for evidence of arrhythmia and/or significant heart rate changes.**} {Remember to include, when applicable, any/all of the following data: independent review of imaging independent review of labs (comment specifically on pertinent positives and negatives) review of specific prior hospitalizations, PCP/specialist notes, etc. discuss meds given and prescribed document any discussion with consultants (including hospitalists) any clinical decision tools you used and why (PECARN, NEXUS, etc.) did you consider admitting the patient? document social determinants of health affecting patient's care (homelessness, inability to follow up in a timely fashion, etc) document any pre-existing conditions increasing risk on current visit (e.g. diabetes and HTN increasing danger of high-risk chest pain/ACS) describes what meds you gave (especially parenteral) and why any other interventions?:1}     FINAL CLINICAL IMPRESSION(S) / ED DIAGNOSES   Final  diagnoses:  None     Rx / DC Orders   ED Discharge Orders     None        Note:  This document was prepared using Dragon voice recognition software and may include unintentional dictation errors.

## 2023-01-19 NOTE — ED Triage Notes (Signed)
Pt sts that she has been having L flank pain and sts that she went to a [facility] clinic recently for a urinalysis but has not seen the results. Pt sts she recently found out she is [redacted] weeks pregnant. Pt denies any vaginal bleeding or anything.

## 2023-01-20 NOTE — ED Provider Notes (Incomplete)
[facility] Provider Note    Event Date/Time   First MD Initiated Contact with Patient 01/19/23 2030     (approximate)   History   Flank Pain (L)   HPI  Stacey Reed is a 32 y.o. female [phone] currently [redacted] weeks pregnant by dates who presents with left upper abdominal pain radiating to the side of her abdomen, acute onset  Left upper quadrant pain since yesterday, intermittent, sharp, not related to specific movements, no other significant associated symptoms  I reviewed the past medical records.  The patient was seen by [facility] gynecology on 5/10 for pregnancy of unknown anatomic location.  Bedside ultrasound at that time showed an IUP with gestational sac and yolk sac present.   Physical Exam   Triage Vital Signs: ED Triage Vitals  Enc Vitals Group     BP 01/19/23 2012 113/72     Pulse Rate 01/19/23 2012 91     Resp 01/19/23 2012 18     Temp 01/19/23 2012 98.6 F (37 C)     Temp src --      SpO2 01/19/23 2012 99 %     Weight 01/19/23 2011 240 lb 4.8 oz (109 kg)     Height 01/19/23 2011 5' (1.524 m)     Head Circumference --      Peak Flow --      Pain Score 01/19/23 2013 7     Pain Loc --      Pain Edu? --      Excl. in GC? --     Most recent vital signs: Vitals:   01/19/23 2012  BP: 113/72  Pulse: 91  Resp: 18  Temp: 98.6 F (37 C)  SpO2: 99%     General: Awake, no distress.  CV:  Good peripheral perfusion.  Resp:  Normal effort.  Abd:  No distention.  Other:     ED Results / Procedures / Treatments   Labs (all labs ordered are listed, but only abnormal results are displayed) Labs Reviewed  CBC WITH DIFFERENTIAL/PLATELET - Abnormal; Notable for the following components:      Result Value   WBC 13.6 (*)    Neutro Abs 8.2 (*)    All other components within normal limits  BASIC METABOLIC PANEL - Abnormal; Notable for the following components:   Sodium 134 (*)    Glucose, Bld 104 (*)    All other components within  normal limits  URINALYSIS, COMPLETE (UACMP) WITH MICROSCOPIC - Abnormal; Notable for the following components:   Color, Urine YELLOW (*)    APPearance HAZY (*)    Leukocytes,Ua TRACE (*)    All other components within normal limits  HCG, QUANTITATIVE, PREGNANCY     EKG  ***   RADIOLOGY *** {USE THE WORD "INTERPRETED"!! You MUST document your own interpretation of imaging, as well as the fact that you reviewed the radiologist's report!:1}   PROCEDURES:  Critical Care performed: {CriticalCareYesNo:19197::"Yes, see critical care procedure note(s)","No"}  Procedures   MEDICATIONS ORDERED IN ED: Medications - No data to display   IMPRESSION / MDM / ASSESSMENT AND PLAN / ED COURSE  I reviewed the triage vital signs and the nursing notes.                              Differential diagnosis includes, but is not limited to, ***  Patient's presentation is most consistent with {EM COPA:27473}  *** {  If the patient is on the monitor, remove the brackets and asterisks on the sentence below and remember to document it as a Procedure as well. Otherwise delete the sentence below:1} {**The patient is on the cardiac monitor to evaluate for evidence of arrhythmia and/or significant heart rate changes.**} {Remember to include, when applicable, any/all of the following data: independent review of imaging independent review of labs (comment specifically on pertinent positives and negatives) review of specific prior hospitalizations, PCP/specialist notes, etc. discuss meds given and prescribed document any discussion with consultants (including hospitalists) any clinical decision tools you used and why (PECARN, NEXUS, etc.) did you consider admitting the patient? document social determinants of health affecting patient's care (homelessness, inability to follow up in a timely fashion, etc) document any pre-existing conditions increasing risk on current visit (e.g. diabetes and HTN  increasing danger of high-risk chest pain/ACS) describes what meds you gave (especially parenteral) and why any other interventions?:1}     FINAL CLINICAL IMPRESSION(S) / ED DIAGNOSES   Final diagnoses:  None     Rx / DC Orders   ED Discharge Orders     None        Note:  This document was prepared using Dragon voice recognition software and may include unintentional dictation errors.

## 2023-02-07 DIAGNOSIS — O09899 Supervision of other high risk pregnancies, unspecified trimester: Secondary | ICD-10-CM | POA: Insufficient documentation

## 2023-02-07 DIAGNOSIS — Z2839 Other underimmunization status: Secondary | ICD-10-CM | POA: Insufficient documentation

## 2023-02-07 DIAGNOSIS — O26899 Other specified pregnancy related conditions, unspecified trimester: Secondary | ICD-10-CM | POA: Insufficient documentation

## 2023-02-07 DIAGNOSIS — Z8759 Personal history of other complications of pregnancy, childbirth and the puerperium: Secondary | ICD-10-CM | POA: Insufficient documentation

## 2023-02-07 DIAGNOSIS — Z8632 Personal history of gestational diabetes: Secondary | ICD-10-CM | POA: Insufficient documentation

## 2023-02-07 DIAGNOSIS — Z641 Problems related to multiparity: Secondary | ICD-10-CM | POA: Insufficient documentation

## 2023-02-18 ENCOUNTER — Emergency Department
Admission: EM | Admit: 2023-02-18 | Discharge: 2023-02-18 | Disposition: A | Discharge status: Home / Self Care | Payer: Medicaid Other | Attending: Emergency Medicine | Admitting: Emergency Medicine

## 2023-02-18 ENCOUNTER — Encounter: Payer: Self-pay | Admitting: Emergency Medicine

## 2023-02-18 ENCOUNTER — Other Ambulatory Visit: Payer: Self-pay

## 2023-02-18 DIAGNOSIS — M545 Low back pain, unspecified: Secondary | ICD-10-CM | POA: Diagnosis present

## 2023-02-18 DIAGNOSIS — M5442 Lumbago with sciatica, left side: Secondary | ICD-10-CM | POA: Insufficient documentation

## 2023-02-18 DIAGNOSIS — M5432 Sciatica, left side: Secondary | ICD-10-CM

## 2023-02-18 MED ORDER — CYCLOBENZAPRINE HCL 10 MG PO TABS: 10.0000 mg | ORAL_TABLET | Freq: Three times a day (TID) | ORAL | 0 refills | Status: AC | PRN

## 2023-02-18 NOTE — ED Notes (Signed)
See triage note  Presents with lower back pain which is moving into left leg  Pain started about 1 week ago w/o known injury  Ambulates well   Pt is [redacted] weeks pregnant

## 2023-02-18 NOTE — ED Provider Notes (Signed)
[facility] Provider Note    Event Date/Time   First MD Initiated Contact with Patient 02/18/23 1034     (approximate)   History   Back Pain   HPI  Stacey Reed is a 32 y.o. female [redacted] weeks pregnant by ultrasound with PMH of fibromyalgia, migraine, and joint pain who presents for evaluation of back pain.  Patient states she has had radiating pain down the back of her left leg for about a week but it has recently worsened.  She denies numbness and weakness in the leg.  No recent trauma to the area.  She has been taking 500 mg of Tylenol every 6 hours to manage her pain at home.      Physical Exam   Triage Vital Signs: ED Triage Vitals  Enc Vitals Group     BP 02/18/23 1011 132/77     Pulse Rate 02/18/23 1011 95     Resp 02/18/23 1011 18     Temp 02/18/23 1011 98.3 F (36.8 C)     Temp Source 02/18/23 1011 Oral     SpO2 02/18/23 1011 99 %     Weight 02/18/23 1010 238 lb (108 kg)     Height 02/18/23 1010 5' (1.524 m)     Head Circumference --      Peak Flow --      Pain Score 02/18/23 1010 8     Pain Loc --      Pain Edu? --      Excl. in GC? --     Most recent vital signs: Vitals:   02/18/23 1011 02/18/23 1139  BP: 132/77 118/77  Pulse: 95 91  Resp: 18 17  Temp: 98.3 F (36.8 C)   SpO2: 99% 99%    General: Awake, no distress.  CV:  Good peripheral perfusion.  Resp:  Normal effort.  Abd:  No distention.  Other:  5 out of 5 strength in lower extremity, sensation intact across all dermatomes, peripheral pulse 2+ regular, able to walk, toe walk, heel walk, heel toe walk.   ED Results / Procedures / Treatments   Labs (all labs ordered are listed, but only abnormal results are displayed) Labs Reviewed - No data to display    PROCEDURES:  Critical Care performed: No  Procedures   MEDICATIONS ORDERED IN ED: Medications - No data to display   IMPRESSION / MDM / ASSESSMENT AND PLAN / ED COURSE  I reviewed the triage vital  signs and the nursing notes.                             Patient presents for evaluation of back pain. Differential diagnosis includes, but is not limited to, sciatica, muscle strain, cauda equina syndrome.  Patient's presentation is most consistent with acute, uncomplicated illness.  Patient describes radiating pain down the posterior left leg consistent with sciatica.  Patient denies any symptoms of cauda equina syndrome.  On exam I did not notice any weakness or numbness.  I believe patient is appropriate for outpatient management.  Since she is pregnant I am limited in terms of pharmacologic treatment.  I advised that she continue taking the Tylenol and I will also prescribe a muscle relaxer.  I gave patient a handout on stretches to try and advised her to follow-up with her PCP for potential PT referral if she has continued symptoms.  Patient voiced understanding, all questions were answered and  she is stable at discharge.       FINAL CLINICAL IMPRESSION(S) / ED DIAGNOSES   Final diagnoses:  Sciatica of left side     Rx / DC Orders   ED Discharge Orders          Ordered    cyclobenzaprine (FLEXERIL) 10 MG tablet  3 times daily PRN        02/18/23 1145             Note:  This document was prepared using Dragon voice recognition software and may include unintentional dictation errors.   Cameron Ali, PA-C 02/18/23 1157    Dionne Bucy, MD 02/18/23 2018

## 2023-02-18 NOTE — ED Notes (Addendum)
Note made in error; wrong pt.  

## 2023-02-18 NOTE — ED Triage Notes (Signed)
Patient to ED via POV for left lower back pain that radiates down into leg. Started 1 week ago but worse today. Approx [redacted] weeks pregnant.

## 2023-02-18 NOTE — Discharge Instructions (Addendum)
Continue taking the Tylenol as needed for pain.  You can also take Flexeril, which is a muscle relaxer, 3 times a day as needed.  Do not drive while taking this medication as it can cause drowsiness.   Follow up with your PCP if you do not have symptom resolution after trying the stretches, taking the Tylenol and Flexeril.  They might want to consider a PT referral.

## 2023-02-18 NOTE — ED Notes (Signed)
Provider at bedside. Pt states provider aware of pain level and told her they would put in orders to address her pain soon.

## 2023-02-18 NOTE — ED Notes (Signed)
Pt sitting calmly on stretcher, skin dry and resp reg/unlabored. In NAD. Visitors at bedside.

## 2023-04-22 DIAGNOSIS — O98512 Other viral diseases complicating pregnancy, second trimester: Secondary | ICD-10-CM | POA: Insufficient documentation

## 2023-04-25 ENCOUNTER — Ambulatory Visit
Admission: EM | Admit: 2023-04-25 | Discharge: 2023-04-25 | Disposition: A | Discharge status: Home / Self Care | Payer: Medicaid Other | Source: Home / Self Care | Attending: Family Medicine | Admitting: Family Medicine

## 2023-04-25 DIAGNOSIS — Z3A21 21 weeks gestation of pregnancy: Secondary | ICD-10-CM

## 2023-04-25 DIAGNOSIS — H6591 Unspecified nonsuppurative otitis media, right ear: Secondary | ICD-10-CM

## 2023-04-25 MED ORDER — AMOXICILLIN-POT CLAVULANATE 875-125 MG PO TABS: 1.0000 | ORAL_TABLET | Freq: Two times a day (BID) | ORAL | 0 refills | Status: DC

## 2023-04-25 NOTE — Discharge Instructions (Signed)
Tylenol 3-4 times a day as needed.  Take antibiotics with food.  Warm compress on the neck in the region might help.
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
## 2023-04-25 NOTE — ED Triage Notes (Signed)
Patient tested positive for covid Monday 04-21-23. She comes for right ear pain.

## 2023-06-08 ENCOUNTER — Observation Stay: Payer: Medicaid Other

## 2023-06-08 ENCOUNTER — Observation Stay
Admission: EM | Admit: 2023-06-08 | Discharge: 2023-06-08 | Disposition: A | Discharge status: Home / Self Care | Payer: Medicaid Other | Attending: Obstetrics and Gynecology | Admitting: Obstetrics and Gynecology

## 2023-06-08 ENCOUNTER — Encounter: Payer: Self-pay | Admitting: Obstetrics and Gynecology

## 2023-06-08 ENCOUNTER — Other Ambulatory Visit: Payer: Self-pay

## 2023-06-08 DIAGNOSIS — Z3A27 27 weeks gestation of pregnancy: Secondary | ICD-10-CM | POA: Diagnosis not present

## 2023-06-08 DIAGNOSIS — Z8616 Personal history of COVID-19: Secondary | ICD-10-CM | POA: Diagnosis not present

## 2023-06-08 DIAGNOSIS — O4702 False labor before 37 completed weeks of gestation, second trimester: Principal | ICD-10-CM | POA: Insufficient documentation

## 2023-06-08 DIAGNOSIS — O2342 Unspecified infection of urinary tract in pregnancy, second trimester: Secondary | ICD-10-CM | POA: Insufficient documentation

## 2023-06-08 DIAGNOSIS — R350 Frequency of micturition: Principal | ICD-10-CM | POA: Diagnosis present

## 2023-06-08 LAB — URINALYSIS, COMPLETE (UACMP) WITH MICROSCOPIC
Bilirubin Urine: NEGATIVE
Glucose, UA: NEGATIVE mg/dL
Hgb urine dipstick: NEGATIVE
Ketones, ur: NEGATIVE mg/dL
Nitrite: NEGATIVE
Protein, ur: NEGATIVE mg/dL
Specific Gravity, Urine: 1.028 (ref 1.005–1.030)
pH: 5 (ref 5.0–8.0)

## 2023-06-08 LAB — WET PREP, GENITAL
Clue Cells Wet Prep HPF POC: NONE SEEN
Sperm: NONE SEEN
Trich, Wet Prep: NONE SEEN
WBC, Wet Prep HPF POC: <10 (ref <10)
Yeast Wet Prep HPF POC: NONE SEEN

## 2023-06-08 LAB — CHLAMYDIA/NGC RT PCR (ARMC ONLY)
Chlamydia Tr: NOT DETECTED
N gonorrhoeae: NOT DETECTED

## 2023-06-08 MED ORDER — AMOXICILLIN 500 MG PO CAPS: 500.0000 mg | ORAL_CAPSULE | Freq: Three times a day (TID) | ORAL | 0 refills | Status: AC
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
## 2023-06-08 NOTE — OB Triage Note (Signed)
Patient is a  32 yo, G10 P8, at 27 weeks 6 days. Patient presents with complaints of uti symptoms and braxton hicks.  Patient denies any vaginal bleeding or LOF. Patient reports +FM. Monitors applied and assessing. VSS. Initial fetal heart tone 135 bpm. [place], CNM notified of patients arrival to unit. Plan to complete UA and NST

## 2023-06-08 NOTE — OB Triage Note (Signed)
Patient given discharge instructions as per AVS. Verbalized understanding. Discharged in stable condition ambulatory, accompanied by family member.

## 2023-06-23 DIAGNOSIS — O24415 Gestational diabetes mellitus in pregnancy, controlled by oral hypoglycemic drugs: Secondary | ICD-10-CM | POA: Diagnosis present

## 2023-07-01 ENCOUNTER — Other Ambulatory Visit: Payer: Self-pay

## 2023-07-01 ENCOUNTER — Ambulatory Visit
Admission: EM | Admit: 2023-07-01 | Discharge: 2023-07-01 | Disposition: A | Discharge status: Home / Self Care | Payer: Medicaid Other

## 2023-07-01 DIAGNOSIS — Z3A31 31 weeks gestation of pregnancy: Secondary | ICD-10-CM

## 2023-07-01 DIAGNOSIS — R0981 Nasal congestion: Secondary | ICD-10-CM

## 2023-07-01 DIAGNOSIS — J069 Acute upper respiratory infection, unspecified: Secondary | ICD-10-CM

## 2023-07-01 DIAGNOSIS — R051 Acute cough: Secondary | ICD-10-CM

## 2023-07-01 HISTORY — DX: Gestational diabetes mellitus in pregnancy, unspecified control: O24.419

## 2023-07-01 NOTE — Discharge Instructions (Addendum)
-  Continue Robitussin and Tylenol -Increase rest and fluids. Use throat lozenges -If fever, increased chest pain, shortness of breath seek re-examination -You have a virus and you may be ill for another week
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
## 2023-07-01 NOTE — ED Triage Notes (Addendum)
Pt states she has been coughing x 1 wk and is now having pain on both sides of rib cage area. States she is [redacted] wks pregnant was seen by OB/GYN yesterday had an [place] and every this was ok and feels like the pain is due to her cough. Has been taking mucinex and cough syrup and nothing is working

## 2023-07-07 ENCOUNTER — Ambulatory Visit
Admission: EM | Admit: 2023-07-07 | Discharge: 2023-07-07 | Disposition: A | Discharge status: Home / Self Care | Payer: Medicaid Other | Attending: Emergency Medicine | Admitting: Emergency Medicine

## 2023-07-07 DIAGNOSIS — J069 Acute upper respiratory infection, unspecified: Secondary | ICD-10-CM

## 2023-07-07 MED ORDER — CEFDINIR 300 MG PO CAPS: 300.0000 mg | ORAL_CAPSULE | Freq: Two times a day (BID) | ORAL | 0 refills | Status: AC

## 2023-07-07 MED ORDER — FLUTICASONE PROPIONATE 50 MCG/ACT NA SUSP: 2.0000 | Freq: Every day | NASAL | 1 refills | Status: AC

## 2023-07-07 NOTE — ED Triage Notes (Addendum)
Patient states that she's had a cough x 3 weeks. Patient was seen on 11-5. Patient is pregnant
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
## 2023-07-07 NOTE — Discharge Instructions (Addendum)
As we discussed, your exam is consistent with an upper respiratory infection.  Given that your symptoms have been going on for 3 weeks I do feel a trial of antibiotics is warranted.  Please continue to use the albuterol inhaler as previously prescribed by your perinatologist.  Start using the Flonase at bedtime to help with nasal congestion and postnasal drip.  You can instill 2 squirts up each nostril at bedtime.  As listed above, we will also do a trial of antibiotics and going to start you on cefdinir 300 mg twice daily for 7 days.  If your symptoms not improving, or new symptoms develop, either return for reevaluation or follow-up with your perinatologist.

## 2023-08-07 ENCOUNTER — Other Ambulatory Visit: Payer: Self-pay

## 2023-08-07 ENCOUNTER — Observation Stay: Payer: Medicaid Other

## 2023-08-07 ENCOUNTER — Observation Stay
Admission: EM | Admit: 2023-08-07 | Discharge: 2023-08-08 | Disposition: A | Discharge status: Home / Self Care | Payer: Medicaid Other | Attending: Obstetrics and Gynecology | Admitting: Obstetrics and Gynecology

## 2023-08-07 DIAGNOSIS — O368390 Maternal care for abnormalities of the fetal heart rate or rhythm, unspecified trimester, not applicable or unspecified: Secondary | ICD-10-CM | POA: Diagnosis present

## 2023-08-07 DIAGNOSIS — R7309 Other abnormal glucose: Secondary | ICD-10-CM | POA: Insufficient documentation

## 2023-08-07 DIAGNOSIS — Z3A36 36 weeks gestation of pregnancy: Secondary | ICD-10-CM | POA: Diagnosis not present

## 2023-08-07 DIAGNOSIS — O24415 Gestational diabetes mellitus in pregnancy, controlled by oral hypoglycemic drugs: Secondary | ICD-10-CM | POA: Diagnosis present

## 2023-08-07 DIAGNOSIS — R102 Pelvic and perineal pain: Secondary | ICD-10-CM | POA: Diagnosis not present

## 2023-08-07 DIAGNOSIS — Z8616 Personal history of COVID-19: Secondary | ICD-10-CM | POA: Insufficient documentation

## 2023-08-07 DIAGNOSIS — O368330 Maternal care for abnormalities of the fetal heart rate or rhythm, third trimester, not applicable or unspecified: Secondary | ICD-10-CM | POA: Diagnosis present

## 2023-08-07 DIAGNOSIS — O26893 Other specified pregnancy related conditions, third trimester: Secondary | ICD-10-CM | POA: Diagnosis present

## 2023-08-07 LAB — CBC
HCT: 35.9 % — ABNORMAL LOW (ref 36.0–46.0)
Hemoglobin: 11.3 g/dL — ABNORMAL LOW (ref 12.0–15.0)
MCH: 26.4 pg (ref 26.0–34.0)
MCHC: 31.5 g/dL (ref 30.0–36.0)
MCV: 83.9 fL (ref 80.0–100.0)
Platelets: 248 10*3/uL (ref 150–400)
RBC: 4.28 MIL/uL (ref 3.87–5.11)
RDW: 14.7 % (ref 11.5–15.5)
WBC: 11 10*3/uL — ABNORMAL HIGH (ref 4.0–10.5)
nRBC: 0 % (ref 0.0–0.2)

## 2023-08-07 LAB — TYPE AND SCREEN
ABO/RH(D): A NEG
Antibody Screen: NEGATIVE

## 2023-08-07 LAB — URINALYSIS, COMPLETE (UACMP) WITH MICROSCOPIC
Bacteria, UA: NONE SEEN
Bilirubin Urine: NEGATIVE
Glucose, UA: NEGATIVE mg/dL
Hgb urine dipstick: NEGATIVE
Ketones, ur: NEGATIVE mg/dL
Nitrite: NEGATIVE
Protein, ur: NEGATIVE mg/dL
Specific Gravity, Urine: 1.019 (ref 1.005–1.030)
pH: 5 (ref 5.0–8.0)

## 2023-08-07 LAB — WET PREP, GENITAL
Clue Cells Wet Prep HPF POC: NONE SEEN
Sperm: NONE SEEN
Trich, Wet Prep: NONE SEEN
WBC, Wet Prep HPF POC: <10 (ref <10)
Yeast Wet Prep HPF POC: NONE SEEN

## 2023-08-07 LAB — CHLAMYDIA/NGC RT PCR (ARMC ONLY)
Chlamydia Tr: NOT DETECTED
N gonorrhoeae: NOT DETECTED

## 2023-08-07 LAB — ABO/RH: ABO/RH(D): A NEG

## 2023-08-07 LAB — GLUCOSE, CAPILLARY: Glucose-Capillary: 100 mg/dL — ABNORMAL HIGH (ref 70–99)

## 2023-08-07 MED ORDER — ASPIRIN 81 MG PO TBEC
81.0000 mg | DELAYED_RELEASE_TABLET | Freq: Every day | ORAL | Status: DC
  Administered 2023-08-08: 81 mg via ORAL
  Filled 2023-08-07: qty 1

## 2023-08-07 MED ORDER — ACETAMINOPHEN 500 MG PO TABS: 1000.0000 mg | ORAL_TABLET | Freq: Four times a day (QID) | ORAL | Status: DC | PRN

## 2023-08-07 MED ORDER — METFORMIN HCL ER 500 MG PO TB24
500.0000 mg | ORAL_TABLET | Freq: Every day | ORAL | Status: DC
  Administered 2023-08-08: 500 mg via ORAL
  Filled 2023-08-07 (×2): qty 1

## 2023-08-07 MED ORDER — INSULIN GLARGINE-YFGN 100 UNIT/ML ~~LOC~~ SOLN
10.0000 [IU] | Freq: Every day | SUBCUTANEOUS | Status: DC
  Administered 2023-08-07: 10 [IU] via SUBCUTANEOUS
  Filled 2023-08-07: qty 0.1

## 2023-08-07 MED ORDER — PANTOPRAZOLE SODIUM 40 MG PO TBEC
40.0000 mg | DELAYED_RELEASE_TABLET | Freq: Two times a day (BID) | ORAL | Status: DC
  Administered 2023-08-08: 40 mg via ORAL
  Filled 2023-08-07: qty 1

## 2023-08-07 MED ORDER — CALCIUM CARBONATE ANTACID 500 MG PO CHEW: 2.0000 | CHEWABLE_TABLET | ORAL | Status: DC | PRN

## 2023-08-07 MED ORDER — COMPLETENATE 29-1 MG PO CHEW: 1.0000 | CHEWABLE_TABLET | Freq: Every day | ORAL | Status: DC

## 2023-08-07 MED ORDER — FERROUS SULFATE 325 (65 FE) MG PO TABS: 325.0000 mg | ORAL_TABLET | ORAL | Status: DC

## 2023-08-07 MED ORDER — PRENATAL MULTIVITAMIN CH
1.0000 | ORAL_TABLET | Freq: Every day | ORAL | Status: DC
  Administered 2023-08-08: 1 via ORAL

## 2023-08-07 NOTE — Progress Notes (Signed)
Pt presents to L/D triage with reported constant pelvic pressure- aggravated with some position changes, and intermittent abdominal pain last night. Pt rates pain 8/10 and reports no LOF or bleeding. Pt reports positive fetal movement. Pt reports no abnormal discharge and no constipation. Monitors applied and assessing. Initial FHT 135. VSS.
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
## 2023-08-08 DIAGNOSIS — O368330 Maternal care for abnormalities of the fetal heart rate or rhythm, third trimester, not applicable or unspecified: Secondary | ICD-10-CM | POA: Diagnosis not present

## 2023-08-08 LAB — RPR: RPR Ser Ql: NONREACTIVE

## 2023-08-08 LAB — GLUCOSE, CAPILLARY
Glucose-Capillary: 67 mg/dL — ABNORMAL LOW (ref 70–99)
Glucose-Capillary: 104 mg/dL — ABNORMAL HIGH (ref 70–99)

## 2023-08-08 MED ORDER — ACETAMINOPHEN 500 MG PO TABS: 1000.0000 mg | ORAL_TABLET | Freq: Four times a day (QID) | ORAL | Status: AC | PRN

## 2023-08-08 NOTE — Discharge Summary (Signed)
Patient ID: Stacey Reed MRN: [id number] DOB/AGE: Mar 23, 1991 32 y.o.  Admit date: 08/07/2023 Discharge date: 08/08/2023  Admission Diagnoses: fetal heart rate deceleration, 36wks pregnancy, pelvic pressure  Discharge Diagnoses: reactive NST, Category I tracing, pelvic pressure, 36wks pregnancy  Prenatal Care Site:  [facility]  Prenatal Procedures: NST, prolonged fetal monitoring, and ultrasound  Consults: none  Significant Diagnostic Studies:  Results for orders placed or performed during the hospital encounter of 08/07/23 (from the past week)  Urinalysis, Complete w Microscopic -Urine, Clean Catch   Collection Time: 08/07/23 10:32 AM  Result Value Ref Range   Color, Urine YELLOW (A) YELLOW   APPearance HAZY (A) CLEAR   Specific Gravity, Urine 1.019 1.005 - 1.030   pH 5.0 5.0 - 8.0   Glucose, UA NEGATIVE NEGATIVE mg/dL   Hgb urine dipstick NEGATIVE NEGATIVE   Bilirubin Urine NEGATIVE NEGATIVE   Ketones, ur NEGATIVE NEGATIVE mg/dL   Protein, ur NEGATIVE NEGATIVE mg/dL   Nitrite NEGATIVE NEGATIVE   Leukocytes,Ua MODERATE (A) NEGATIVE   RBC / HPF 0-5 0 - 5 RBC/hpf   WBC, UA 0-5 0 - 5 WBC/hpf   Bacteria, UA NONE SEEN NONE SEEN   Squamous Epithelial / HPF 21-50 0 - 5 /HPF   Mucus PRESENT   Wet prep, genital   Collection Time: 08/07/23  1:54 PM   Specimen: Vaginal  Result Value Ref Range   Yeast Wet Prep HPF POC NONE SEEN NONE SEEN   Trich, Wet Prep NONE SEEN NONE SEEN   Clue Cells Wet Prep HPF POC NONE SEEN NONE SEEN   WBC, Wet Prep HPF POC <10 <10   Sperm NONE SEEN   Chlamydia/NGC rt PCR ([facility] only)   Collection Time: 08/07/23  1:54 PM   Specimen: Vaginal  Result Value Ref Range   Specimen source GC/Chlam ENDOCERVICAL    Chlamydia Tr NOT DETECTED NOT DETECTED   N gonorrhoeae NOT DETECTED NOT DETECTED  CBC   Collection Time: 08/07/23  1:54 PM  Result Value Ref Range   WBC 11.0 (H) 4.0 - 10.5 K/uL   RBC 4.28 3.87 - 5.11 MIL/uL   Hemoglobin 11.3  (L) 12.0 - 15.0 g/dL   HCT 56.2 (L) 13.0 - 86.5 %   MCV 83.9 80.0 - 100.0 fL   MCH 26.4 26.0 - 34.0 pg   MCHC 31.5 30.0 - 36.0 g/dL   RDW 78.4 69.6 - 29.5 %   Platelets 248 150 - 400 K/uL   nRBC 0.0 0.0 - 0.2 %  Type and screen [facility]   Collection Time: 08/07/23  1:54 PM  Result Value Ref Range   ABO/RH(D) A NEG    Antibody Screen NEG    Sample Expiration      08/10/2023,2359 Performed at [facility], [address] [place], [place] [address]   RPR   Collection Time: 08/07/23  2:44 PM  Result Value Ref Range   RPR Ser Ql NON REACTIVE NON REACTIVE  ABO/Rh   Collection Time: 08/07/23  2:44 PM  Result Value Ref Range   ABO/RH(D)      A NEG Performed at [facility], [address] [place], [place] [address]   Glucose, capillary   Collection Time: 08/07/23  8:32 PM  Result Value Ref Range   Glucose-Capillary 100 (H) 70 - 99 mg/dL  Glucose, capillary   Collection Time: 08/08/23  6:26 AM  Result Value Ref Range   Glucose-Capillary 67 (L) 70 - 99 mg/dL  Treatments: prolonged fetal monitoring  Hospital Course:  This is a 32 y.o. G10P8 with IUP at [redacted]w[redacted]d admitted for a fetal heart rate deceleration, noted to have a cervical exam of 1/60/-2.  No leaking of fluid and no bleeding.  She initially came to the hospital for increased pelvic pressure and was going to be discharged but then was noted to have a fetal heart rate deceleration around 1300 yesterday. The fetal heart rate monitoring was reassuring before and after the fetal heart rate deceleration. She was observed overnight with continuous fetal monitoring x 12 hours, fetal heart rate monitoring remained reassuring, and she had no signs/symptoms of progressing preterm labor or other maternal-fetal concerns.  She had another NST in the morning which was reactive and reassuring. Her cervical exam was unchanged from admission.  She was deemed stable for discharge to home with  outpatient follow up.  Discharge Physical Exam:   BP 117/67   Pulse 98   Temp 98.1 F (36.7 C) (Oral)   Resp 16   Ht 5' (1.524 m)   Wt 112.5 kg   LMP 11/27/2021 (Approximate)   BMI 48.43 kg/m   General: NAD CV: RRR Pulm: CTABL, nl effort ABD: s/nd/nt, gravid DVT Evaluation: LE non-ttp, no evidence of DVT on exam.  SVE: Dilation: 1 Effacement (%): 60 Cervical Position: Posterior Station: -2 Presentation: Undeterminable Exam by:: D Tishia Maestre CNM Fetal monitoring: Baseline: 130 bpm, Variability: Good {> 6 bpm), Accelerations: Reactive, and Decelerations: Absent  TOCO: contractions rare; mild to palpation    Discharge Condition: Stable  Disposition: Discharge disposition: 01-Home or Self Care       Discharge Instructions     Discharge activity:  No Restrictions   Complete by: As directed    Discharge diet:   Complete by: As directed    Gestational Diabetes diet, carb modified   Fetal Kick Count:  Lie on our left side for one hour after a meal, and count the number of times your baby kicks.  If it is less than 5 times, get up, move around and drink some juice.  Repeat the test 30 minutes later.  If it is still less than 5 kicks in an hour, notify your doctor.   Complete by: As directed    No sexual activity restrictions   Complete by: As directed    Notify physician for a general feeling that "something is not right"   Complete by: As directed    Notify physician for increase or change in vaginal discharge   Complete by: As directed    Notify physician for intestinal cramps, with or without diarrhea, sometimes described as "gas pain"   Complete by: As directed    Notify physician for leaking of fluid   Complete by: As directed    Notify physician for low, dull backache, unrelieved by heat or Tylenol   Complete by: As directed    Notify physician for menstrual like cramps   Complete by: As directed    More than 6/hr   Notify physician for pelvic pressure    Complete by: As directed    Notify physician for uterine contractions.  These may be painless and feel like the uterus is tightening or the baby is  "balling up"   Complete by: As directed    Notify physician for vaginal bleeding   Complete by: As directed    PRETERM LABOR:  Includes any of the follwing symptoms that occur between 20 - [redacted] weeks gestation.  If these  symptoms are not stopped, preterm labor can result in preterm delivery, placing your baby at risk   Complete by: As directed       Allergies as of 08/08/2023       Reactions   Doxycycline Swelling   Sulfa Antibiotics Hives, Swelling   Other reaction(s): Edema   Metronidazole Hives   Nitrofurantoin Hives   Mild only after a few days of taking the med Mild only after a few days of taking the med   Sulfa Antibiotics Swelling        Medication List     TAKE these medications    acetaminophen 500 MG tablet Commonly known as: TYLENOL Take 2 tablets (1,000 mg total) by mouth every 6 (six) hours as needed (for pain scale < 4  OR  temperature  >/=  100.5 F).   aspirin EC 81 MG tablet Take by mouth.   cetirizine 10 MG tablet Commonly known as: ZYRTEC Take 1 tablet (10 mg total) by mouth daily.   famotidine 20 MG tablet Commonly known as: PEPCID Take 1 tablet (20 mg total) by mouth 2 (two) times daily.   ferrous sulfate 325 (65 FE) MG EC tablet Take 325 mg by mouth daily as needed.   fluticasone 50 MCG/ACT nasal spray Commonly known as: FLONASE Place 2 sprays into both nostrils daily.   Lantus SoloStar 100 UNIT/ML Solostar Pen Generic drug: insulin glargine Inject 10 Units into the skin at bedtime.   metformin 500 MG (OSM) 24 hr tablet Commonly known as: FORTAMET Take 500 mg by mouth daily with breakfast.   omeprazole 40 MG capsule Commonly known as: PRILOSEC Take 40 mg by mouth 2 (two) times daily.   Prenatal Vitamins 28-0.8 MG Tabs Take 1 tablet by mouth daily.   promethazine 12.5 MG  tablet Commonly known as: PHENERGAN Take by mouth.        Follow-up Information     [facility]. Go in 3 day(s).   Why: keep scheduled prenatal visit                Signed:  Janyce Llanos 08/08/2023 9:51 AM ----- Donato Schultz, CNM Certified Nurse Midwife [facility]

## 2023-08-08 NOTE — Progress Notes (Signed)
Discharge instructions provided to pt including vaginal bleeding, leaking of fluid, contractions, labor pain relief, and fetal movement reviewed by RN. Pt verbalizes understanding. Follow-up care reviewed. Pt discharged home with family.

## 2023-10-13 IMAGING — CR DG KNEE COMPLETE 4+V*R*
1 series · 4 of 4 positions shown · non-contrast
Comparison: None Available.

CLINICAL DATA: Right knee pain and swelling for 1 week. No known
injury.

EXAM:
RIGHT KNEE - COMPLETE 4+ VIEW

[Series 1: dg knee complete 4 views right · 0.14mm/px · 4 of 4 slices shown]
[im 1/4]
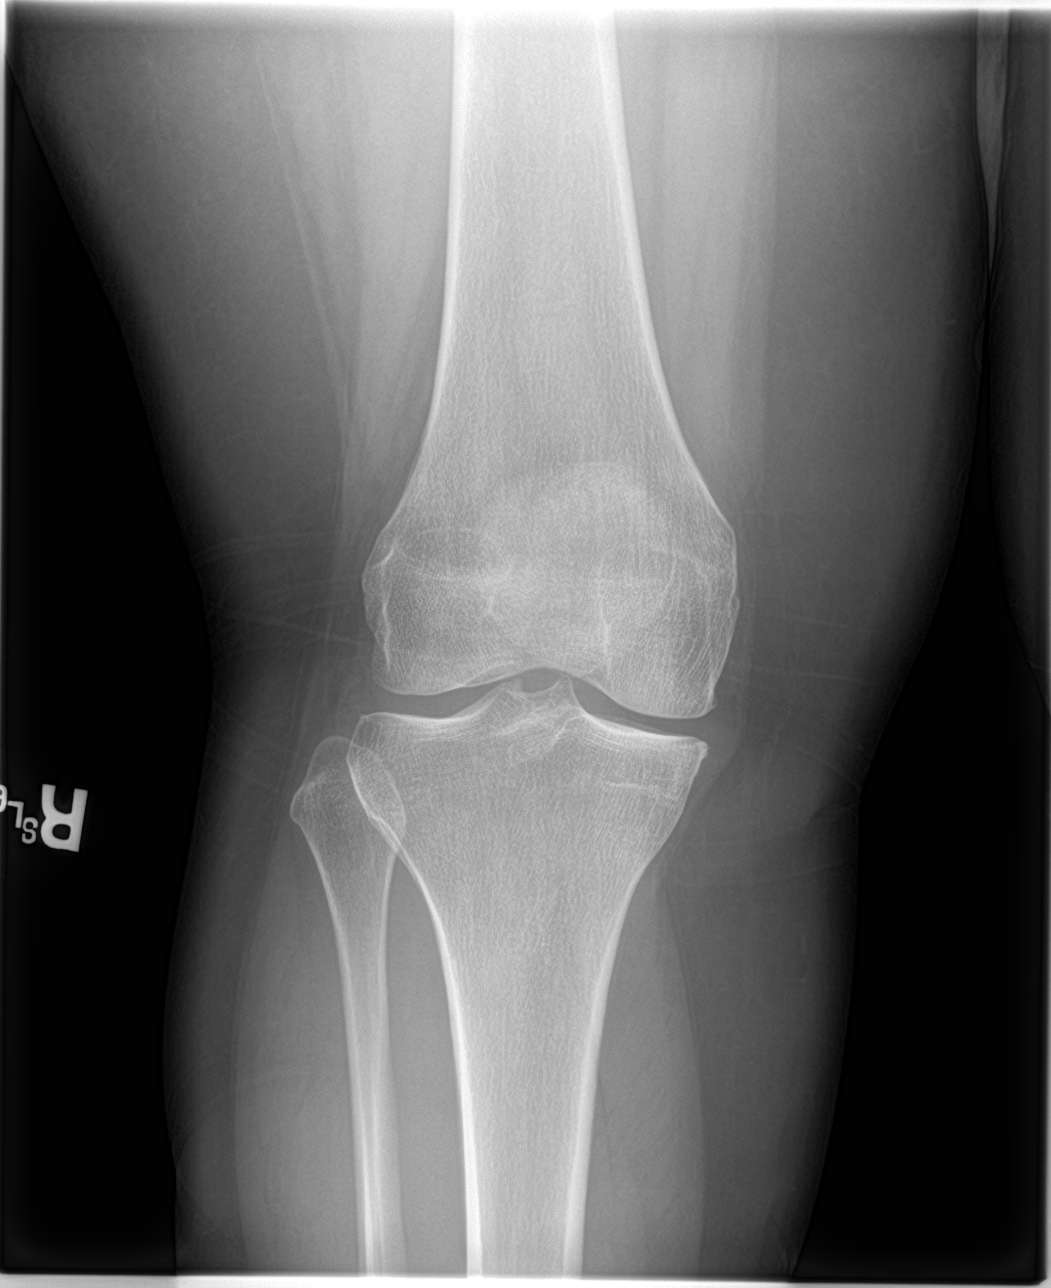
[im 2/4]
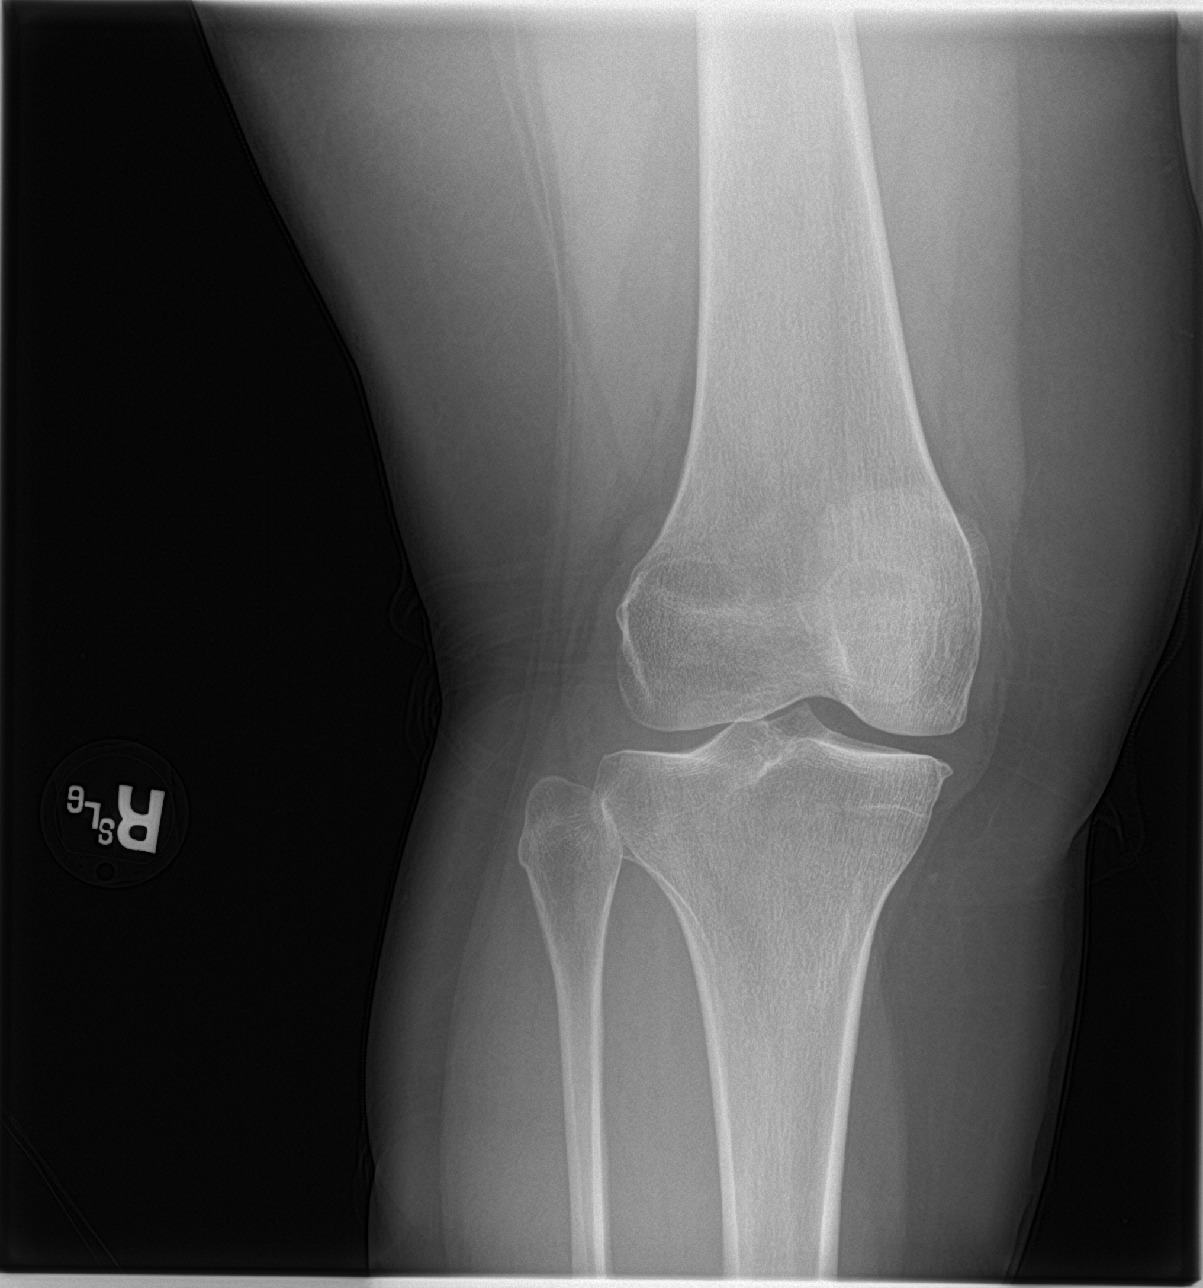
[im 3/4]
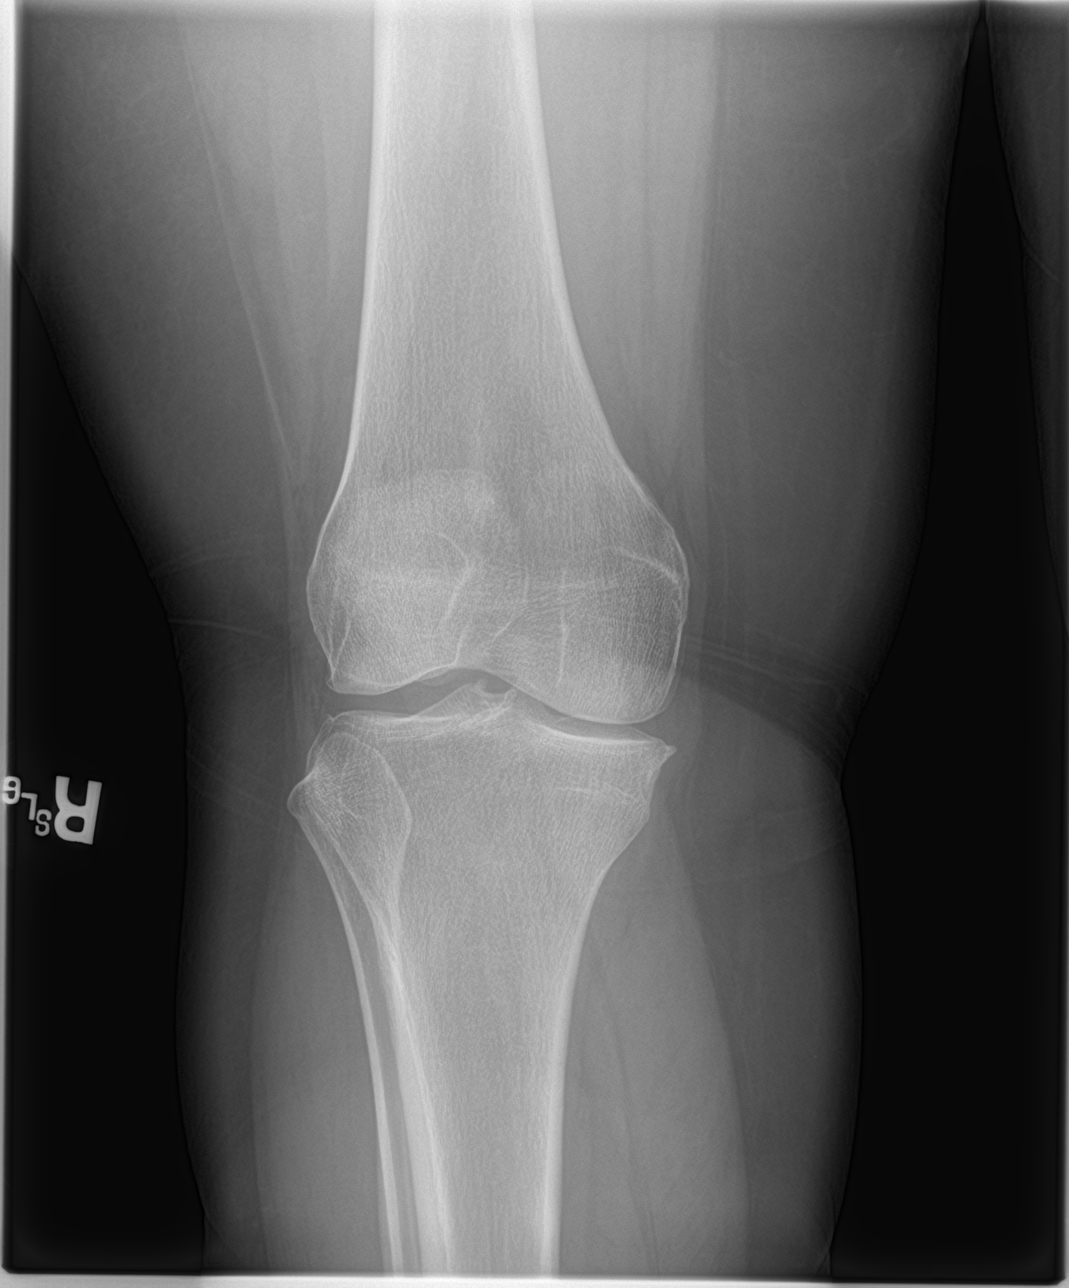
[im 4/4]
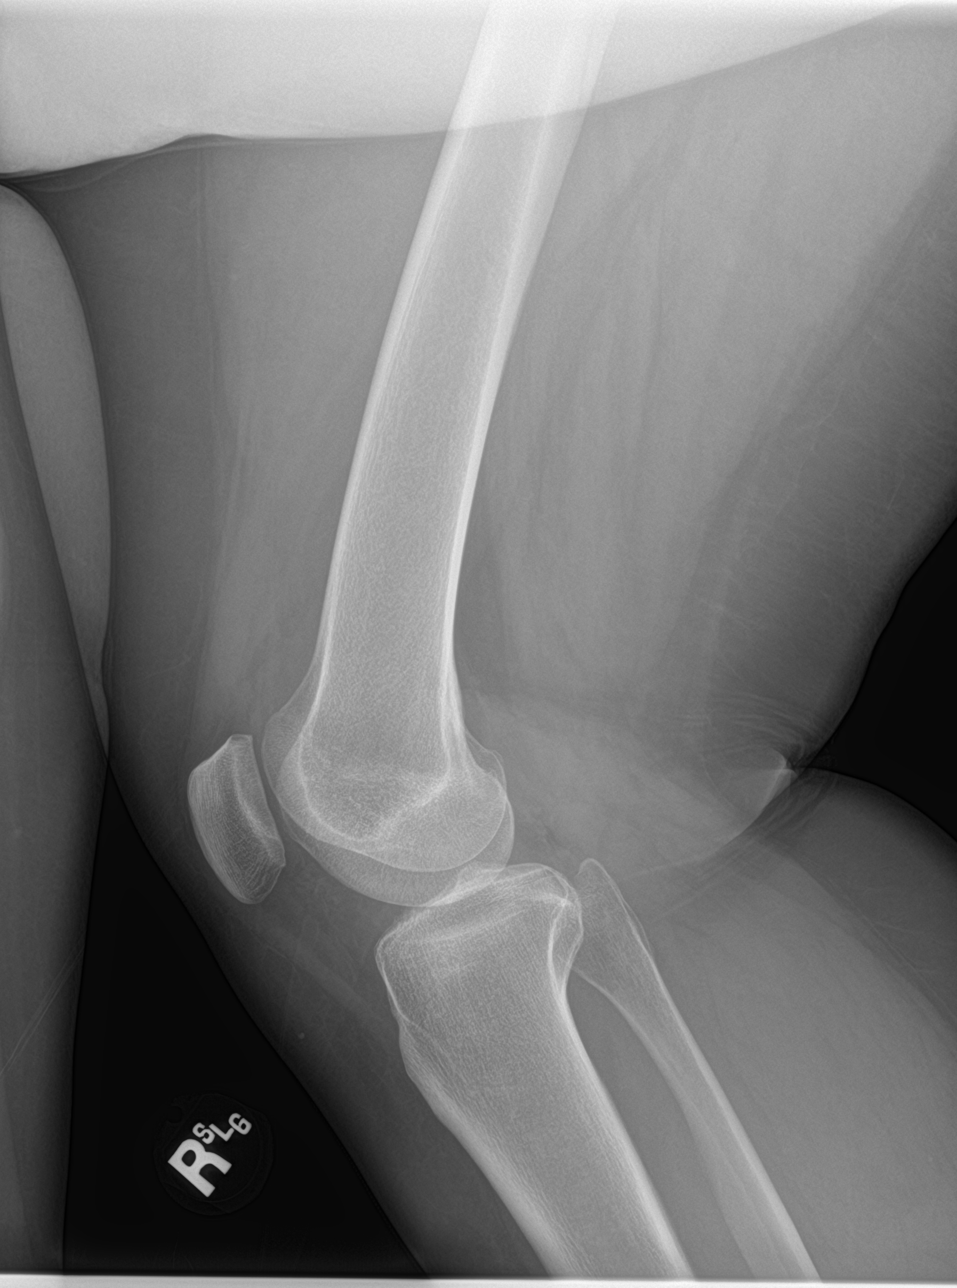

[4 of 4 positions shown; findings below may reference images not displayed]

FINDINGS: Normal alignment. Normal joint spaces. There is trace peripheral
spurring of the tibiofemoral compartments. No fracture, erosion, or
focal bone abnormality. Possible minimal joint effusion. Otherwise
unremarkable soft tissues.
IMPRESSION: Trace peripheral spurring of the tibiofemoral compartments
suggesting early degenerative change. Possible minimal joint
effusion.

## 2023-11-29 ENCOUNTER — Ambulatory Visit
Admission: RE | Admit: 2023-11-29 | Discharge: 2023-11-29 | Disposition: A | Discharge status: Home / Self Care | Payer: Self-pay | Source: Ambulatory Visit | Attending: Family Medicine | Admitting: Family Medicine

## 2023-11-29 VITALS — BP 114/78 | HR 80 | Temp 98.6°F | Resp 14 | Ht 60.0 in | Wt 249.0 lb

## 2023-11-29 DIAGNOSIS — R350 Frequency of micturition: Secondary | ICD-10-CM | POA: Diagnosis not present

## 2023-11-29 DIAGNOSIS — Z3202 Encounter for pregnancy test, result negative: Secondary | ICD-10-CM | POA: Diagnosis not present

## 2023-11-29 DIAGNOSIS — M62838 Other muscle spasm: Secondary | ICD-10-CM

## 2023-11-29 LAB — URINALYSIS, W/ REFLEX TO CULTURE (INFECTION SUSPECTED)
Bilirubin Urine: NEGATIVE
Glucose, UA: NEGATIVE mg/dL
Hgb urine dipstick: NEGATIVE
Ketones, ur: NEGATIVE mg/dL
Leukocytes,Ua: NEGATIVE
Nitrite: NEGATIVE
Protein, ur: NEGATIVE mg/dL
RBC / HPF: NONE SEEN RBC/hpf (ref 0–5)
Specific Gravity, Urine: 1.015 (ref 1.005–1.030)
WBC, UA: NONE SEEN WBC/hpf (ref 0–5)
pH: 5.5 (ref 5.0–8.0)

## 2023-11-29 LAB — PREGNANCY, URINE: Preg Test, Ur: NEGATIVE

## 2023-11-29 MED ORDER — CYCLOBENZAPRINE HCL 5 MG PO TABS: 5.0000 mg | ORAL_TABLET | Freq: Two times a day (BID) | ORAL | 0 refills | Status: DC

## 2023-11-29 MED ORDER — CYCLOBENZAPRINE HCL 5 MG PO TABS
5.0000 mg | ORAL_TABLET | Freq: Two times a day (BID) | ORAL | 0 refills | Status: DC | PRN
Start: 2023-11-29 — End: 2023-11-29

## 2023-11-29 NOTE — Discharge Instructions (Signed)
 The clinic will contact you with results of the urine culture done today if positive.  Continue rest and fluids.  May take Flexeril twice daily as needed for your muscle pain.  Please that this medication will make you drowsy.  Do not drink alcohol or drive on this medication.  Heat to this muscles and rest.  Please follow-up with your PCP in 2 days for recheck.  Please go to the ER if you develop any worsening symptoms.  Hope you feel better soon!
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
## 2023-11-29 NOTE — ED Triage Notes (Signed)
 Patient c/o urinary frequency that started a week ago.  Patient c/o lower back pain and radiates up to her left shoulder and neck. Patient denies N/V/D.

## 2024-02-16 ENCOUNTER — Ambulatory Visit: Admission: EM | Admit: 2024-02-16 | Discharge: 2024-02-16 | Disposition: A | Discharge status: Home / Self Care

## 2024-02-16 DIAGNOSIS — H9201 Otalgia, right ear: Secondary | ICD-10-CM | POA: Diagnosis present

## 2024-02-16 DIAGNOSIS — Z6841 Body Mass Index (BMI) 40.0 and over, adult: Secondary | ICD-10-CM | POA: Insufficient documentation

## 2024-02-16 DIAGNOSIS — M5416 Radiculopathy, lumbar region: Secondary | ICD-10-CM | POA: Insufficient documentation

## 2024-02-16 DIAGNOSIS — J029 Acute pharyngitis, unspecified: Secondary | ICD-10-CM | POA: Diagnosis present

## 2024-02-16 DIAGNOSIS — F32A Depression, unspecified: Secondary | ICD-10-CM | POA: Diagnosis not present

## 2024-02-16 DIAGNOSIS — Z9889 Other specified postprocedural states: Secondary | ICD-10-CM | POA: Diagnosis not present

## 2024-02-16 DIAGNOSIS — J069 Acute upper respiratory infection, unspecified: Secondary | ICD-10-CM | POA: Diagnosis present

## 2024-02-16 DIAGNOSIS — F419 Anxiety disorder, unspecified: Secondary | ICD-10-CM | POA: Diagnosis not present

## 2024-02-16 DIAGNOSIS — K219 Gastro-esophageal reflux disease without esophagitis: Secondary | ICD-10-CM | POA: Diagnosis not present

## 2024-02-16 DIAGNOSIS — E669 Obesity, unspecified: Secondary | ICD-10-CM | POA: Diagnosis not present

## 2024-02-16 LAB — GROUP A STREP BY PCR: Group A Strep by PCR: NOT DETECTED

## 2024-02-16 MED ORDER — PROMETHAZINE-DM 6.25-15 MG/5ML PO SYRP: 5.0000 mL | ORAL_SOLUTION | Freq: Four times a day (QID) | ORAL | 0 refills | Status: AC | PRN

## 2024-02-16 MED ORDER — IPRATROPIUM BROMIDE 0.06 % NA SOLN: 2.0000 | Freq: Four times a day (QID) | NASAL | 12 refills | Status: AC

## 2024-02-16 MED ORDER — BENZONATATE 100 MG PO CAPS: 200.0000 mg | ORAL_CAPSULE | Freq: Three times a day (TID) | ORAL | 0 refills | Status: AC
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
## 2024-02-16 NOTE — Discharge Instructions (Addendum)
 Your testing today was negative for strep.  Your symptoms are consistent with a viral upper respiratory tract infection.  You may use over-the-counter Tylenol  and/or ibuprofen  according the package instructions as needed for fever or pain.  To soothe your sore throat you may gargle with warm salt water as often as you would like.  Mix 1 tablespoon of table salt in 8 ounces of warm water, gargle and spit.  You may also use over-the-counter Chloraseptic or Sucrets lozenges.  No more than 1 lozenge every 2 hours as the menthol may give you diarrhea.  Use the Atrovent  nasal spray, 2 squirts in each nostril every 6 hours, as needed for runny nose and postnasal drip.  Use the Tessalon Perles every 8 hours during the day.  Take them with a small sip of water.  They may give you some numbness to the base of your tongue or a metallic taste in your mouth, this is normal.  Use the Promethazine DM cough syrup at bedtime for cough and congestion.  It will make you drowsy so do not take it during the day.  Return for reevaluation or see your primary care provider for any new or worsening symptoms.

## 2024-02-16 NOTE — ED Triage Notes (Signed)
 Pt c/o sore throat,ear pain & congestion x4 days. Has tried IBU w/o relief.

## 2024-07-10 ENCOUNTER — Emergency Department

## 2024-07-10 ENCOUNTER — Other Ambulatory Visit: Payer: Self-pay

## 2024-07-10 ENCOUNTER — Emergency Department
Admission: EM | Admit: 2024-07-10 | Discharge: 2024-07-10 | Disposition: A | Discharge status: Home / Self Care | Attending: Emergency Medicine | Admitting: Emergency Medicine

## 2024-07-10 DIAGNOSIS — R42 Dizziness and giddiness: Secondary | ICD-10-CM | POA: Insufficient documentation

## 2024-07-10 DIAGNOSIS — S46812A Strain of other muscles, fascia and tendons at shoulder and upper arm level, left arm, initial encounter: Secondary | ICD-10-CM | POA: Insufficient documentation

## 2024-07-10 DIAGNOSIS — M25512 Pain in left shoulder: Secondary | ICD-10-CM | POA: Diagnosis present

## 2024-07-10 DIAGNOSIS — H60502 Unspecified acute noninfective otitis externa, left ear: Secondary | ICD-10-CM | POA: Diagnosis not present

## 2024-07-10 DIAGNOSIS — X58XXXA Exposure to other specified factors, initial encounter: Secondary | ICD-10-CM | POA: Insufficient documentation

## 2024-07-10 LAB — CBC
HCT: 36.6 % (ref 36.0–46.0)
Hemoglobin: 12.2 g/dL (ref 12.0–15.0)
MCH: 26.4 pg (ref 26.0–34.0)
MCHC: 33.3 g/dL (ref 30.0–36.0)
MCV: 79.2 fL — ABNORMAL LOW (ref 80.0–100.0)
Platelets: 314 K/uL (ref 150–400)
RBC: 4.62 MIL/uL (ref 3.87–5.11)
RDW: 14 % (ref 11.5–15.5)
WBC: 10.4 K/uL (ref 4.0–10.5)
nRBC: 0 % (ref 0.0–0.2)

## 2024-07-10 LAB — COMPREHENSIVE METABOLIC PANEL WITH GFR
ALT: 17 U/L (ref 0–44)
AST: 17 U/L (ref 15–41)
Albumin: 4.2 g/dL (ref 3.5–5.0)
Alkaline Phosphatase: 65 U/L (ref 38–126)
Anion gap: 8 (ref 5–15)
BUN: 11 mg/dL (ref 6–20)
CO2: 24 mmol/L (ref 22–32)
Calcium: 9.1 mg/dL (ref 8.9–10.3)
Chloride: 109 mmol/L (ref 98–111)
Creatinine, Ser: 0.59 mg/dL (ref 0.44–1.00)
GFR, Estimated: >60 mL/min (ref >60)
Glucose, Bld: 106 mg/dL — ABNORMAL HIGH (ref 70–99)
Potassium: 3.9 mmol/L (ref 3.5–5.1)
Sodium: 141 mmol/L (ref 135–145)
Total Bilirubin: 0.4 mg/dL (ref 0.0–1.2)
Total Protein: 6.8 g/dL (ref 6.5–8.1)

## 2024-07-10 LAB — URINALYSIS, ROUTINE W REFLEX MICROSCOPIC
Bacteria, UA: NONE SEEN
Bilirubin Urine: NEGATIVE
Glucose, UA: NEGATIVE mg/dL
Hgb urine dipstick: NEGATIVE
Ketones, ur: NEGATIVE mg/dL
Nitrite: NEGATIVE
Protein, ur: NEGATIVE mg/dL
Specific Gravity, Urine: 1.028 (ref 1.005–1.030)
pH: 5 (ref 5.0–8.0)

## 2024-07-10 LAB — TROPONIN T, HIGH SENSITIVITY: Troponin T High Sensitivity: <15 ng/L (ref 0–19)

## 2024-07-10 LAB — PREGNANCY, URINE: Preg Test, Ur: NEGATIVE

## 2024-07-10 MED ORDER — OFLOXACIN 0.3 % OT SOLN: 5.0000 [drp] | Freq: Two times a day (BID) | OTIC | 0 refills | Status: AC

## 2024-07-10 MED ORDER — OXYCODONE HCL 5 MG PO TABS
5.0000 mg | ORAL_TABLET | Freq: Once | ORAL | Status: AC
  Administered 2024-07-10: 5 mg via ORAL
  Filled 2024-07-10: qty 1

## 2024-07-10 MED ORDER — IOHEXOL 300 MG/ML  SOLN
100.0000 mL | Freq: Once | INTRAMUSCULAR | Status: AC | PRN
  Administered 2024-07-10: 100 mL via INTRAVENOUS

## 2024-07-10 MED ORDER — KETOROLAC TROMETHAMINE 15 MG/ML IJ SOLN
15.0000 mg | Freq: Once | INTRAMUSCULAR | Status: AC
  Administered 2024-07-10: 15 mg via INTRAVENOUS
  Filled 2024-07-10: qty 1

## 2024-07-10 MED ORDER — LIDOCAINE 5 % EX PTCH
1.0000 | MEDICATED_PATCH | CUTANEOUS | Status: DC
  Administered 2024-07-10: 1 via TRANSDERMAL
  Filled 2024-07-10: qty 1

## 2024-07-10 MED ORDER — ACETAMINOPHEN 500 MG PO TABS
1000.0000 mg | ORAL_TABLET | Freq: Once | ORAL | Status: AC
  Administered 2024-07-10: 1000 mg via ORAL
  Filled 2024-07-10: qty 2

## 2024-07-10 MED ORDER — CYCLOBENZAPRINE HCL 5 MG PO TABS: 5.0000 mg | ORAL_TABLET | Freq: Three times a day (TID) | ORAL | 0 refills | Status: AC | PRN

## 2024-07-10 MED ORDER — IBUPROFEN 600 MG PO TABS: 600.0000 mg | ORAL_TABLET | Freq: Four times a day (QID) | ORAL | 0 refills | Status: AC | PRN

## 2024-07-10 MED ORDER — LIDOCAINE 5 % EX PTCH
1.0000 | MEDICATED_PATCH | CUTANEOUS | 0 refills | Status: AC
Start: 2024-07-10 — End: 2024-07-20

## 2024-07-10 MED ORDER — KETOROLAC TROMETHAMINE 15 MG/ML IJ SOLN: 15.0000 mg | Freq: Once | INTRAMUSCULAR | Status: DC

## 2024-07-10 NOTE — ED Provider Notes (Signed)
 [facility] Provider Note    Event Date/Time   First MD Initiated Contact with Patient 07/10/24 0703     (approximate)   History   Dizziness, Torticollis, and Shoulder Pain   HPI  Stacey Reed is a 33 y.o. female who is otherwise healthy who comes in with neck and shoulder pain that began yesterday.  Patient reports 2 separate complaints.  She first reports having a vibration in her head about a month ago.  She denies a headache or worst headache of her life.  She reports that since then she has had intermittent dizzy episodes.  She denies any dizziness at this time and the dizziness kind of comes and goes.  She denies any trauma to her neck or any neck manipulation.  She reports that yesterday she was brushing her hair and thinks she pulled a muscle wrong but she had developed some neck pain and shoulder pain after brushing her her hair. she reports difficulties with sleeping secondary to the pain.  She reports that she also noticed that her ear was warmer in nature and felt hot compared to the other side and looked a little red.  Therefore she she presented to the ER for evaluations.   Physical Exam   Triage Vital Signs: ED Triage Vitals  Encounter Vitals Group     BP 07/10/24 0659 (!) 130/97     Girls Systolic BP Percentile --      Girls Diastolic BP Percentile --      Boys Systolic BP Percentile --      Boys Diastolic BP Percentile --      Pulse Rate 07/10/24 0659 86     Resp 07/10/24 0659 19     Temp 07/10/24 0659 98.7 F (37.1 C)     Temp Source 07/10/24 0659 Oral     SpO2 07/10/24 0659 100 %     Weight 07/10/24 0634 245 lb (111.1 kg)     Height 07/10/24 0634 5' 3 (1.6 m)     Head Circumference --      Peak Flow --      Pain Score 07/10/24 0634 10     Pain Loc --      Pain Education --      Exclude from Growth Chart --     Most recent vital signs: Vitals:   07/10/24 0659  BP: (!) 130/97  Pulse: 86  Resp: 19  Temp: 98.7 F  (37.1 C)  SpO2: 100%     General: Awake, no distress.  CV:  Good peripheral perfusion.  Resp:  Normal effort.  Abd:  No distention.  Soft and nontender Other:  Patient has a little bit of redness and warmth noted the left ear on pinna but TM looks clear on the inside and the external canal appears normal without any tenderness.  There is no obvious mastoid inflammation.  She has decreased range of motion on the left side secondary to discomfort. OP appears normal. Cranial nerves II through XII are intact.  Equal strength in arms and legs.  Sensation intact  ED Results / Procedures / Treatments   Labs (all labs ordered are listed, but only abnormal results are displayed) Labs Reviewed  CBC - Abnormal; Notable for the following components:      Result Value   MCV 79.2 (*)    All other components within normal limits  COMPREHENSIVE METABOLIC PANEL WITH GFR  URINALYSIS, ROUTINE W REFLEX MICROSCOPIC  CBG MONITORING, ED  POC URINE PREG, ED  TROPONIN T, HIGH SENSITIVITY    RADIOLOGY I have reviewed the ct personally and interpreted no ICH   PROCEDURES:  Critical Care performed: No  Procedures   MEDICATIONS ORDERED IN ED: Medications  lidocaine  (LIDODERM ) 5 % 1 patch (1 patch Transdermal Patch Applied 07/10/24 0730)  acetaminophen  (TYLENOL ) tablet 1,000 mg (1,000 mg Oral Given 07/10/24 0730)  iohexol (OMNIPAQUE) 300 MG/ML solution 100 mL (100 mLs Intravenous Contrast Given 07/10/24 0826)  oxyCODONE (Oxy IR/ROXICODONE) immediate release tablet 5 mg (5 mg Oral Given 07/10/24 0901)     IMPRESSION / MDM / ASSESSMENT AND PLAN / ED COURSE  I reviewed the triage vital signs and the nursing notes.   Patient's presentation is most consistent with acute presentation with potential threat to life or bodily function.   Patient comes in with decreased range of motion of the neck as well as a warm hot left ear.  Could be otitis externa but she denies any swimming that would have  suggested this happening.  Is also strange that she has decreased range of motion of the neck.  Therefore discussed with patient doing CT imaging to rule out any type of abscess, mastoiditis, retroperitoneal abscess.  Given her intermittent dizziness have also added on CT head to ensure no mass in the brain but this seems less likely.  She denies any sudden headache worst headache of her life to suggest subarachnoid and she is otherwise neuro intact.  This seems less consistent with meningitis as she has no fever, no headaches.  Patient is not immunosuppressed to suggest malignant otitis externa.  No evidence of herpes zoster on examination.  No evidence of otitis media.  Urine without evidence of UTI.  CMP reassuring CBC reassuring  Pregnancy test was negative.  Troponin was negative and last time she felt dizzy was yesterday so I do not think need to do a repeat  IMPRESSION: 1. No evidence of mastoiditis. 2. No findings to explain the patient's dizziness.  IMPRESSION: 1. No acute findings or discrete mass in the neck. IMPRESSION: No acute findings.      10:58 AM repeat evaluation patient's ear feels less warm in nature.  She is able to range her neck better and has full rom just some pain with movement.  She denies any heating pads.  This seems more musculoskeletal as it does reside in the trapezius distribution and we discussed Tylenol , ibuprofen , lidocaine  patches and some Flexeril  and not driving or working with this.  We also did discuss covering her with some antibiotics for possible otitis externa.  I do not see evidence of any herpes zoster.  We did discuss following up with ENT for outpatient evaluation.  She expressed understanding and felt comfortable with this plan.  We discussed that if symptoms were worsening or changing she was to return to the ER for repeat evaluation and she expressed understanding and felt comfortable with discharge home      FINAL CLINICAL IMPRESSION(S) /  ED DIAGNOSES   Final diagnoses:  Acute otitis externa of left ear, unspecified type  Strain of left trapezius muscle, initial encounter     Rx / DC Orders   ED Discharge Orders     None        Note:  This document was prepared using Dragon voice recognition software and may include unintentional dictation errors.   Ernest Ronal BRAVO, MD 07/10/24 [phone]

## 2024-07-10 NOTE — Discharge Instructions (Signed)
 Please call ENT on Monday to make a follow-up appointment to be reevaluated.  Use the ibuprofen  with Tylenol  1 g every hours for the next 1 week.  This will help with inflammation.  Use lidocaine  patches to help as well.  The use of Flexeril  for muscle relaxer do not drive or work while on these.  Use the eardrops to help if there is any infection on the outer part of the ear.  Your CT imaging was negative and your workup was reassuring with no fever but if you develop worsening symptoms such as fevers, redness spreading, rash development or any other concerns and please return to the ER for repeat evaluation

## 2024-07-10 NOTE — ED Notes (Signed)
 Pt to CT

## 2024-07-10 NOTE — ED Notes (Signed)
 This RN went over d/c instructions. No further needs expressed.

## 2024-07-10 NOTE — ED Triage Notes (Signed)
 Patient ambulatory to triage with complaints of ongoing dizziness x1 month and left neck/shoulder pain that began yesterday. She states the pain has worsened where she cannot sleep. Dizziness comes and goes, described as vibrations in my head.
